# Patient Record
Sex: Male | Born: 1950
Health system: Southern US, Community
[De-identification: ages and names within clinical notes are randomized; demographics above are authoritative.]

## PROBLEM LIST (undated history)

## (undated) DIAGNOSIS — T7840XA Allergy, unspecified, initial encounter: Secondary | ICD-10-CM

## (undated) DIAGNOSIS — N529 Male erectile dysfunction, unspecified: Secondary | ICD-10-CM

## (undated) DIAGNOSIS — I471 Supraventricular tachycardia, unspecified: Secondary | ICD-10-CM

## (undated) DIAGNOSIS — N433 Hydrocele, unspecified: Secondary | ICD-10-CM

## (undated) DIAGNOSIS — I609 Nontraumatic subarachnoid hemorrhage, unspecified: Secondary | ICD-10-CM

## (undated) DIAGNOSIS — R002 Palpitations: Secondary | ICD-10-CM

## (undated) DIAGNOSIS — D369 Benign neoplasm, unspecified site: Secondary | ICD-10-CM

## (undated) DIAGNOSIS — K635 Polyp of colon: Secondary | ICD-10-CM

## (undated) DIAGNOSIS — H269 Unspecified cataract: Secondary | ICD-10-CM

## (undated) DIAGNOSIS — G5600 Carpal tunnel syndrome, unspecified upper limb: Secondary | ICD-10-CM

## (undated) HISTORY — DX: Male erectile dysfunction, unspecified: N52.9

## (undated) HISTORY — DX: Hydrocele, unspecified: N43.3

## (undated) HISTORY — PX: POLYPECTOMY: SHX149

## (undated) HISTORY — DX: Palpitations: R00.2

## (undated) HISTORY — DX: Nontraumatic subarachnoid hemorrhage, unspecified: I60.9

## (undated) HISTORY — DX: Carpal tunnel syndrome, unspecified upper limb: G56.00

## (undated) HISTORY — DX: Supraventricular tachycardia: I47.1

## (undated) HISTORY — DX: Supraventricular tachycardia, unspecified: I47.10

## (undated) HISTORY — PX: COLONOSCOPY: SHX174

## (undated) HISTORY — DX: Unspecified cataract: H26.9

## (undated) HISTORY — PX: CATARACT EXTRACTION, BILATERAL: SHX1313

## (undated) HISTORY — DX: Benign neoplasm, unspecified site: D36.9

## (undated) HISTORY — DX: Allergy, unspecified, initial encounter: T78.40XA

## (undated) HISTORY — DX: Polyp of colon: K63.5

---

## 1997-11-22 ENCOUNTER — Ambulatory Visit (HOSPITAL_COMMUNITY): Admission: RE | Admit: 1997-11-22 | Discharge: 1997-11-22 | Payer: Self-pay | Admitting: Internal Medicine

## 2001-07-12 DIAGNOSIS — D369 Benign neoplasm, unspecified site: Secondary | ICD-10-CM

## 2001-07-12 HISTORY — DX: Benign neoplasm, unspecified site: D36.9

## 2001-10-26 ENCOUNTER — Emergency Department (HOSPITAL_COMMUNITY): Admission: EM | Admit: 2001-10-26 | Discharge: 2001-10-26 | Payer: Self-pay | Admitting: Emergency Medicine

## 2006-01-09 DIAGNOSIS — I609 Nontraumatic subarachnoid hemorrhage, unspecified: Secondary | ICD-10-CM

## 2006-01-09 HISTORY — DX: Nontraumatic subarachnoid hemorrhage, unspecified: I60.9

## 2006-02-03 ENCOUNTER — Inpatient Hospital Stay (HOSPITAL_COMMUNITY): Admission: EM | Admit: 2006-02-03 | Discharge: 2006-02-05 | Payer: Self-pay | Admitting: Emergency Medicine

## 2006-02-04 ENCOUNTER — Ambulatory Visit: Payer: Self-pay | Admitting: Internal Medicine

## 2006-02-04 ENCOUNTER — Encounter: Payer: Self-pay | Admitting: Internal Medicine

## 2007-01-06 ENCOUNTER — Ambulatory Visit: Payer: Self-pay | Admitting: Gastroenterology

## 2007-01-20 ENCOUNTER — Ambulatory Visit: Payer: Self-pay | Admitting: Gastroenterology

## 2007-10-18 IMAGING — CT CT HEAD W/O CM
1 of 2 series · 13 of 30 positions shown, 17 images · non-contrast
Comparison: 02/03/06.

CLINICAL DATA: Closed head injury with subarachnoid hemorrhage.
 HEAD CT WITHOUT CONTRAST ? 02/05/06:
TECHNIQUE: Contiguous axial CT images were obtained from the base of the skull through the vertex according to standard protocol without contrast.

[Series 2: brain · axial · 0.47mm/px · z∈[+128,+262]mm · 13 of 32 slices shown, 17 images]
[im 3/32  brain]
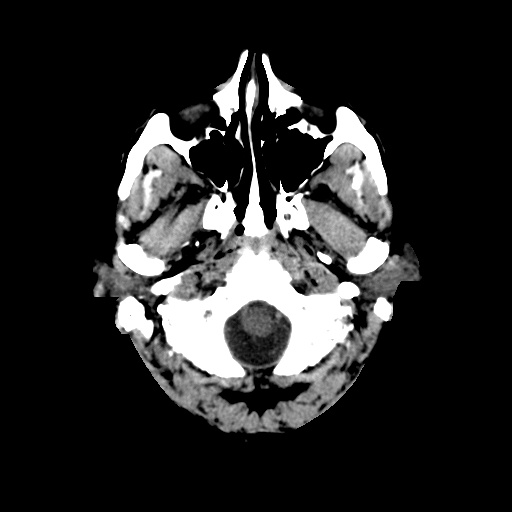
[im 3/32  bone]
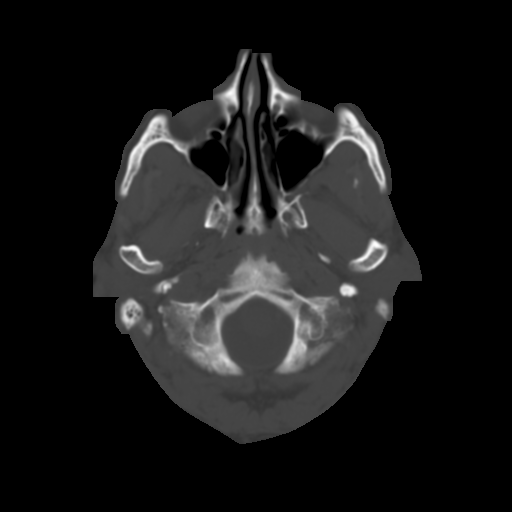
[im 5/32  brain]
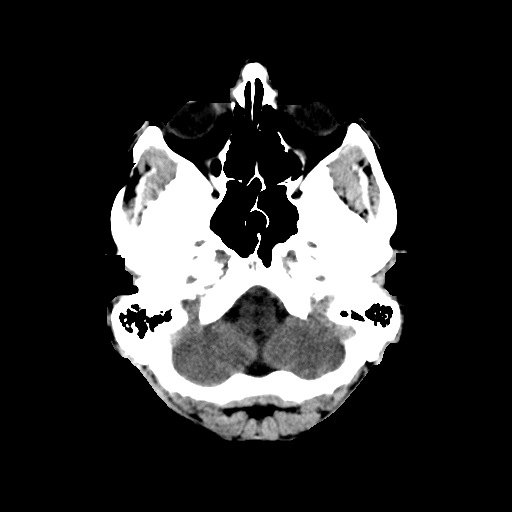
[im 7/32  brain]
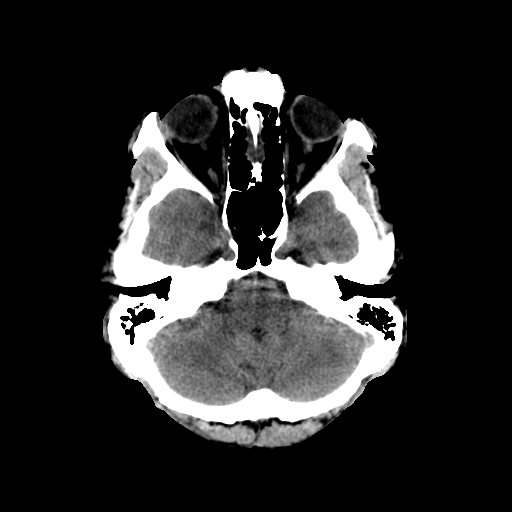
[im 9/32  brain]
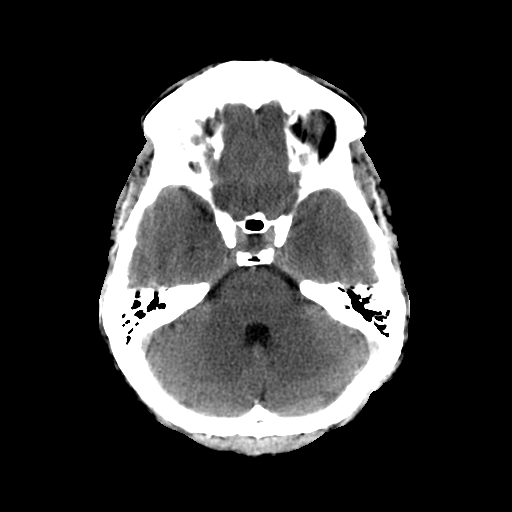
[im 12/32  brain]
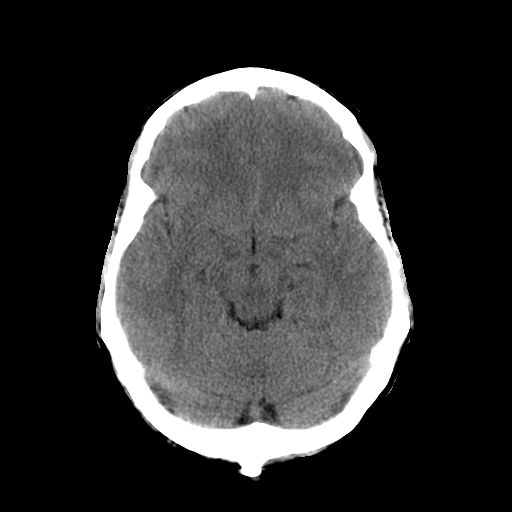
[im 12/32  bone]
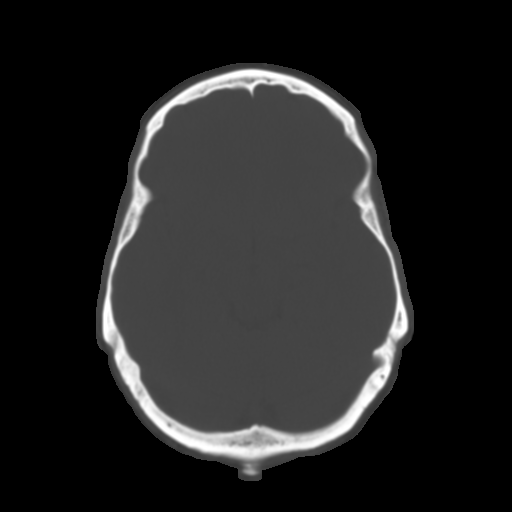
[im 14/32  brain]
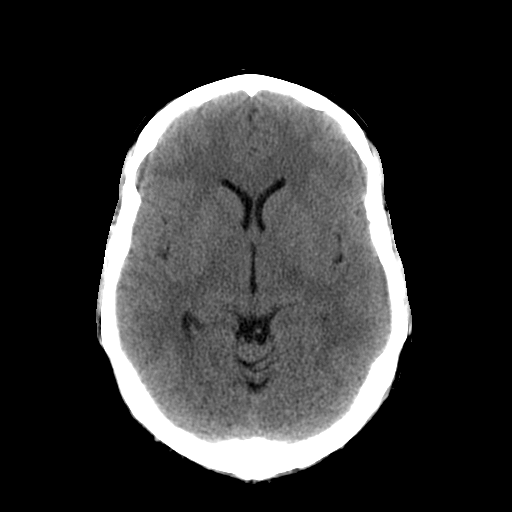
[im 16/32  brain]
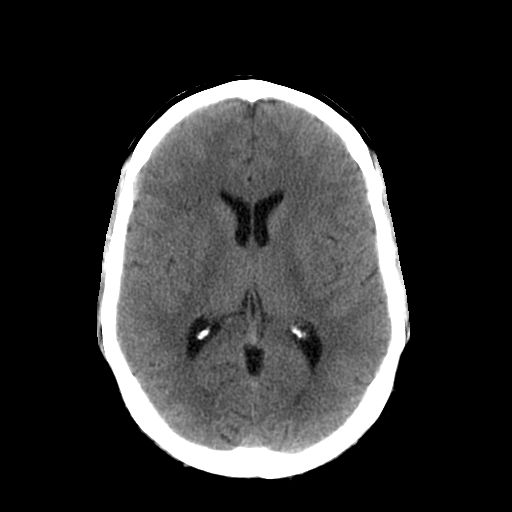
[im 18/32  brain]
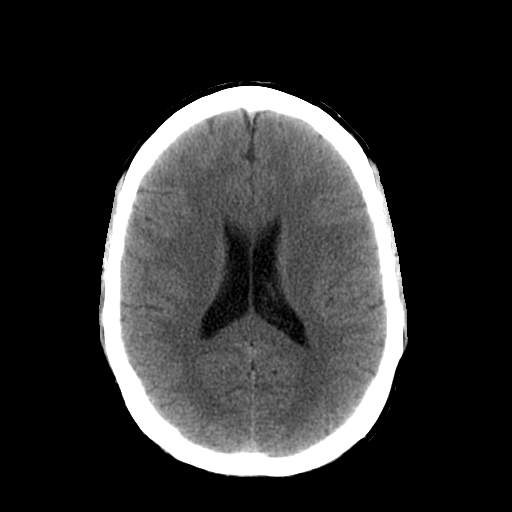
[im 20/32  brain]
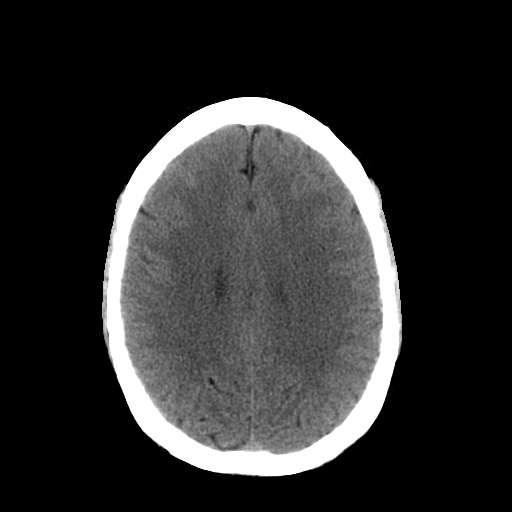
[im 20/32  bone]
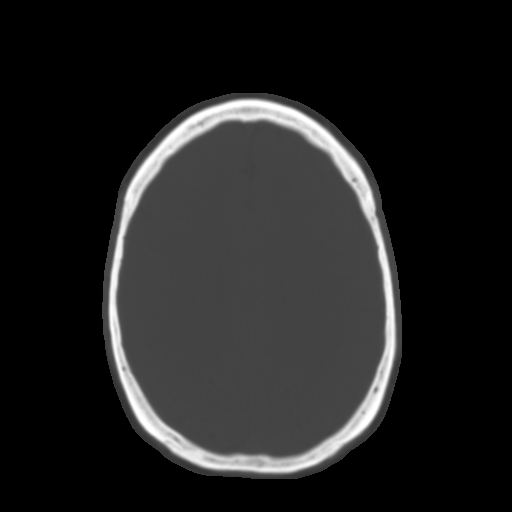
[im 23/32  brain]
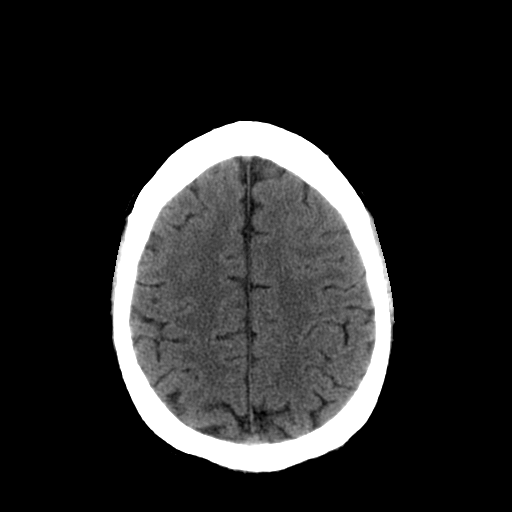
[im 25/32  brain]
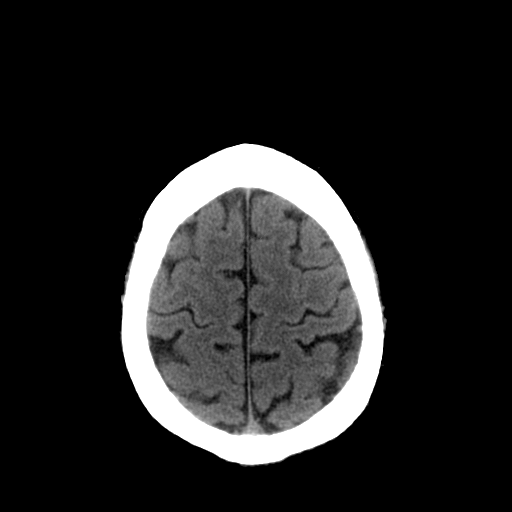
[im 27/32  brain]
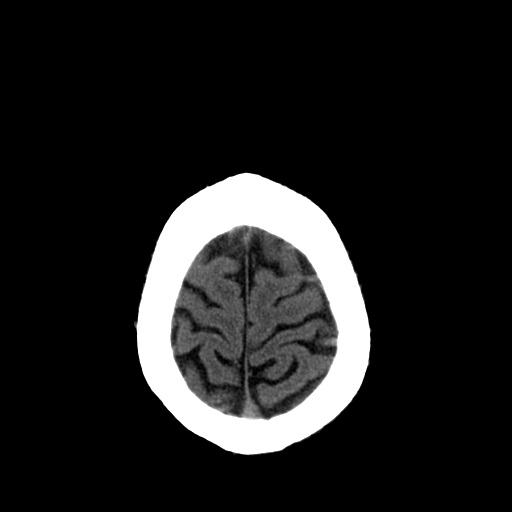
[im 29/32  brain]
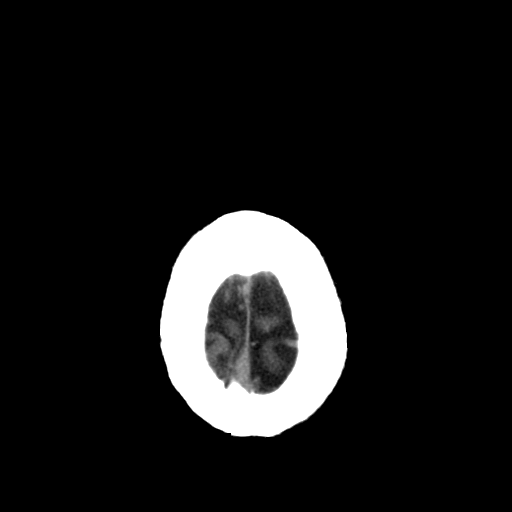
[im 29/32  bone]
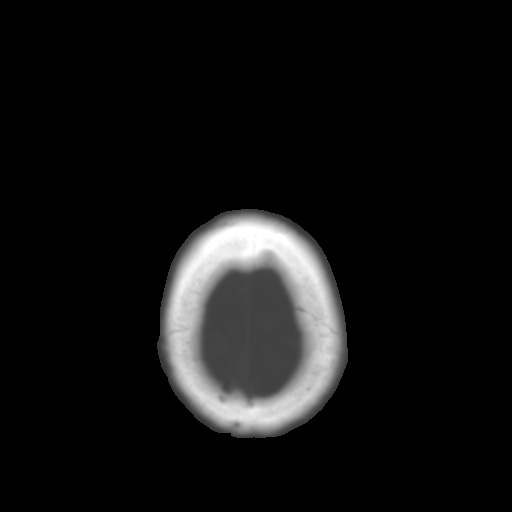

[13 of 30 positions shown; findings below may reference images not displayed]

FINDINGS: The small amount of subarachnoid blood seen on the prior head CT near the vertex is no longer visualized.  No mass effect, extra-axial fluid collections, skull fracture, or hydrocephalus are evident.  No intraventricular blood.
IMPRESSION: Resolution of subarachnoid hemorrhage since the prior CT.  No acute findings.

## 2008-12-06 ENCOUNTER — Encounter: Admission: RE | Admit: 2008-12-06 | Discharge: 2008-12-06 | Payer: Self-pay | Admitting: Internal Medicine

## 2010-08-18 IMAGING — US US SCROTUM
1 series · 13 of 25 positions shown · non-contrast
Comparison: None

CLINICAL DATA: Scrotal swelling.

ULTRASOUND OF SCROTUM
TECHNIQUE: Complete ultrasound examination of the testicles,
epididymis, and other scrotal structures was performed.

[Series 1: us scrotum · 0.07mm/px · 13 of 75 slices shown]
[im 1/75]
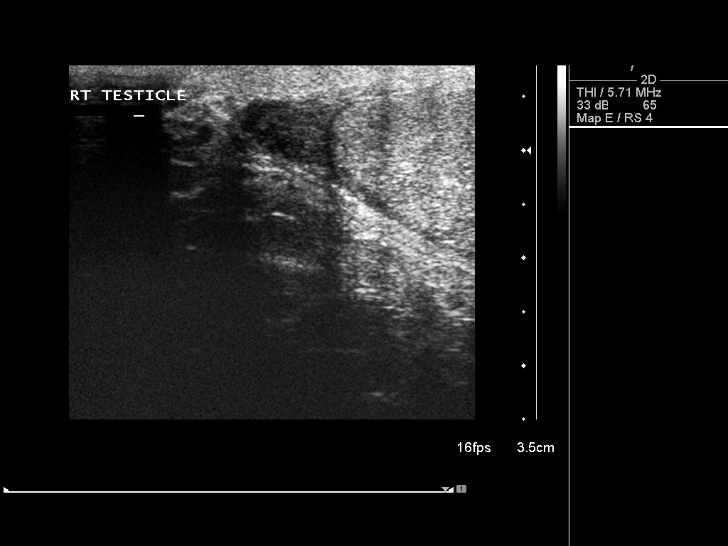
[im 7/75]
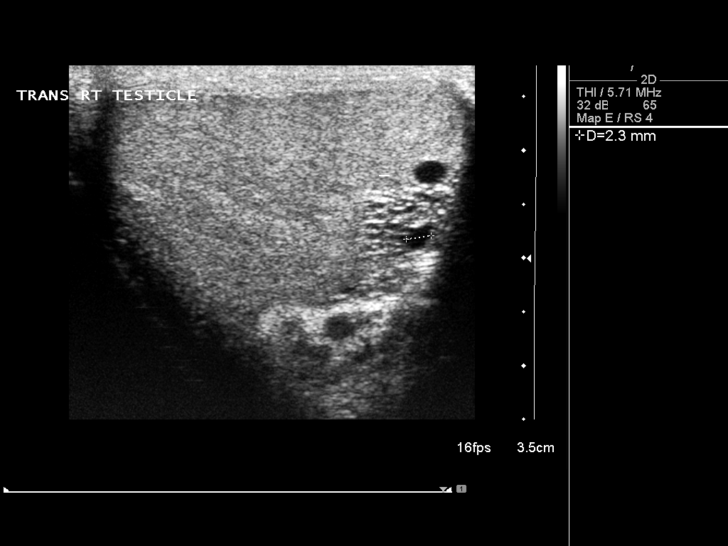
[im 13/75]
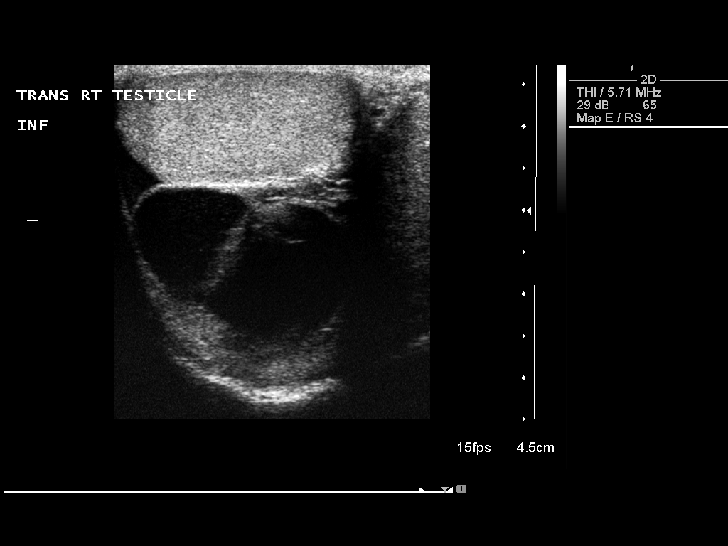
[im 19/75]
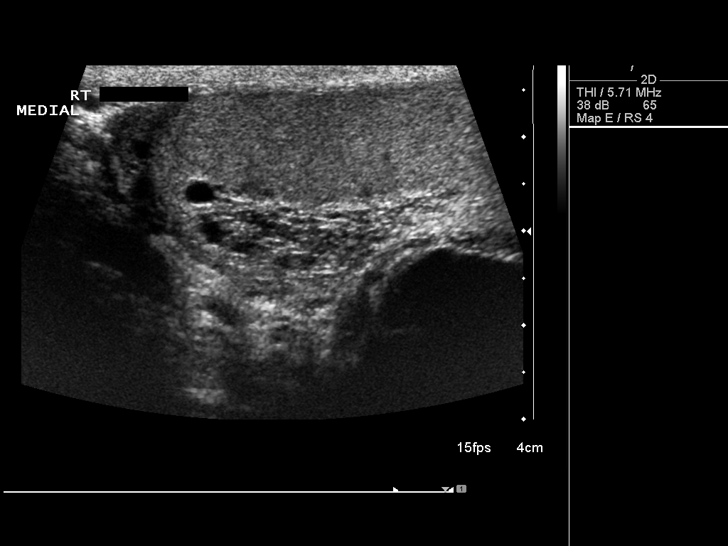
[im 25/75]
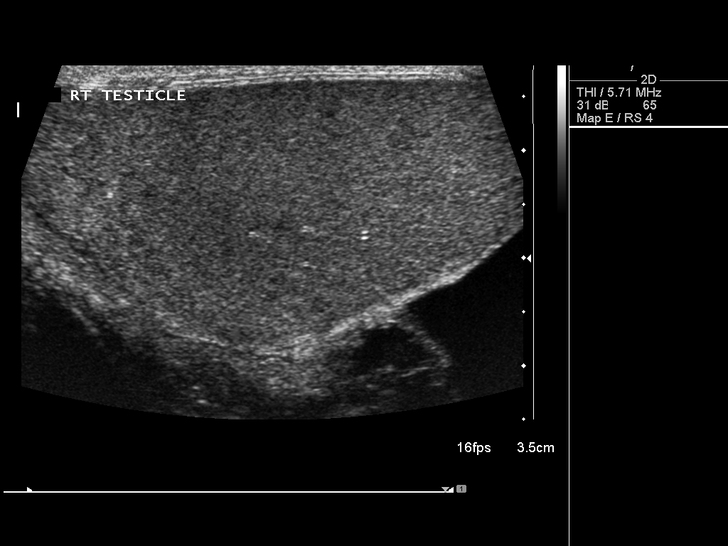
[im 31/75]
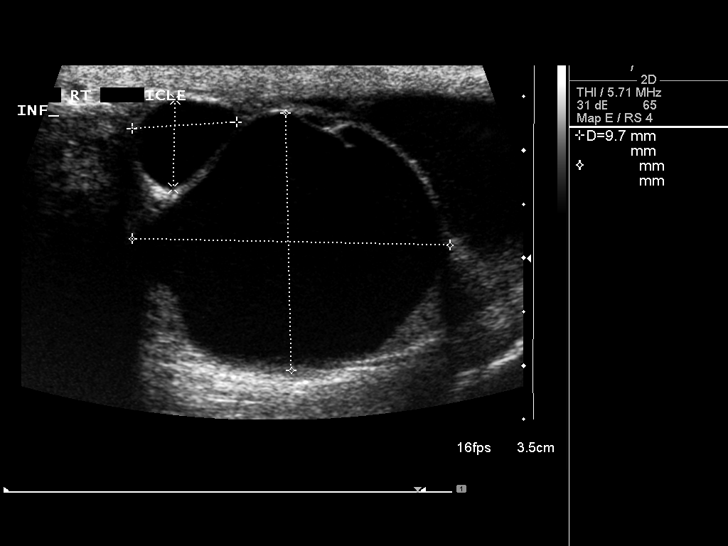
[im 38/75]
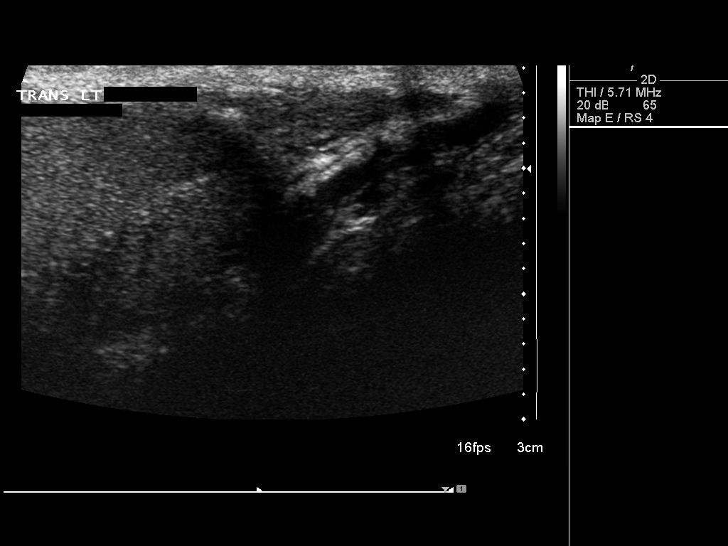
[im 44/75]
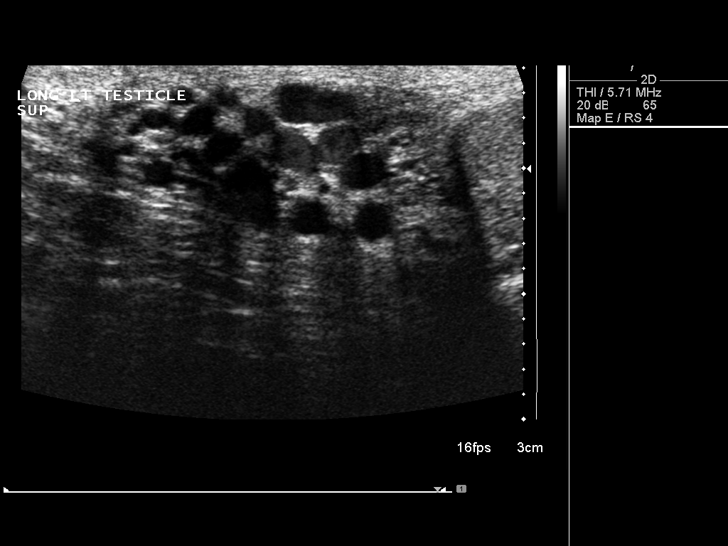
[im 50/75]
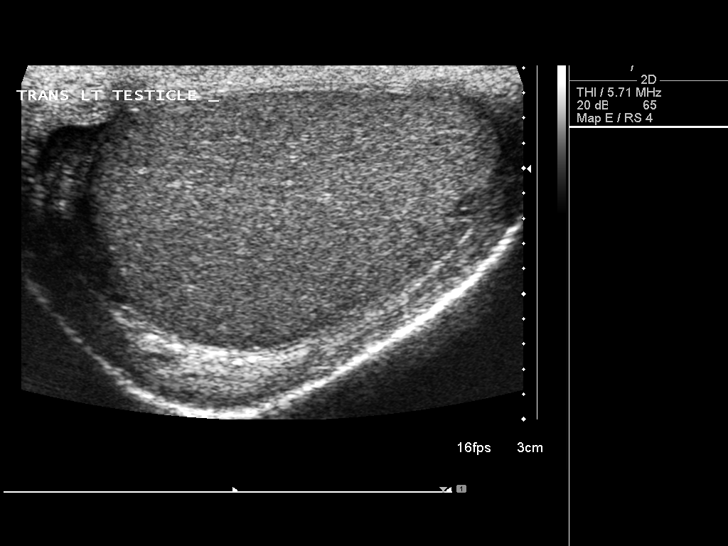
[im 56/75]
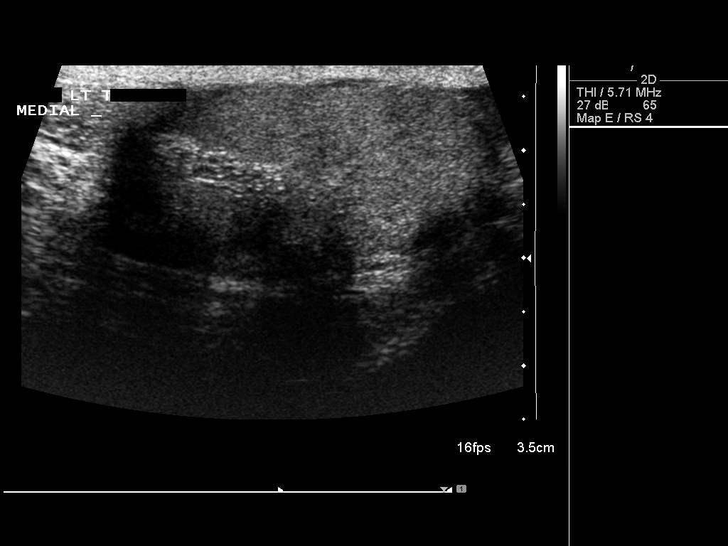
[im 62/75]
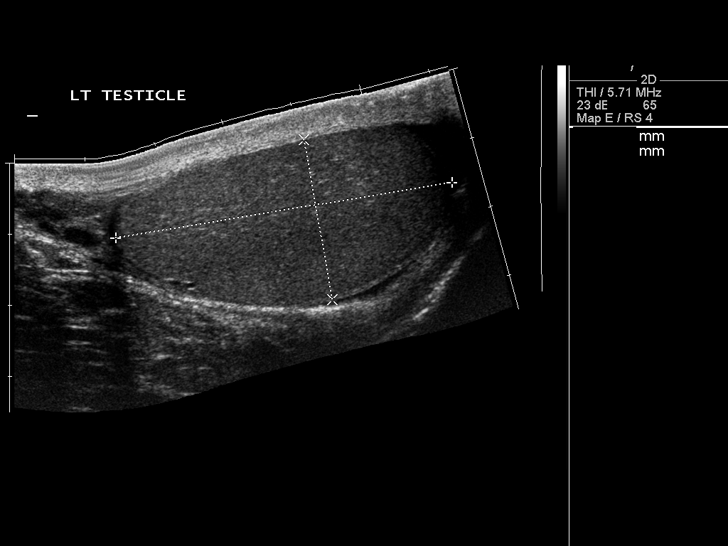
[im 68/75]
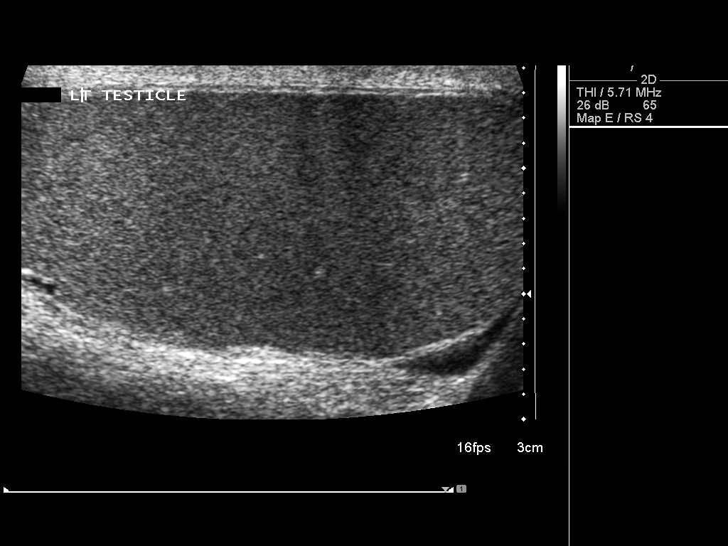
[im 75/75]
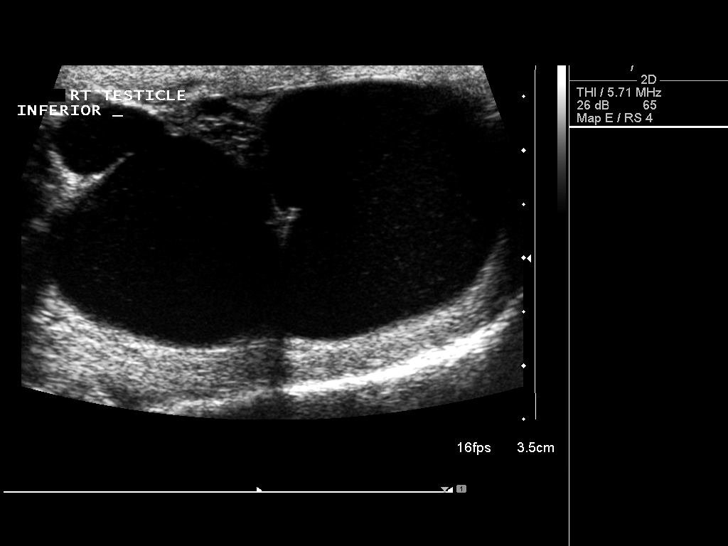

[13 of 25 positions shown; findings below may reference images not displayed]

FINDINGS: The right testicle demonstrates a dilated rete testis,
with multiple small cystic spaces measuring 3 mm or below in size.

Several cystic lesions along the inferior testicular margin on the
right side are noted, with several septations but without a
significant solid component.  These appear nearly continuous with
the dilated rete testis, but also appear to be predominantly
extratesticular.  One of these measures 1.0 x 0.8 x 0.7 cm; the
second measures 3.0 x 2.4 x 2.3 cm; and the third measures 2.6 x
2.5 x 2.7 cm.

The right testicle (without including the cysts) measures 5.3 x
x 3.6 cm.  Color flow noted in the testicular parenchyma although
Doppler analysis was not requested.

Left testicle measures 4.8 x 2.3 x 3.4 cm.  There is a small left
varicocele and a very small left hydrocele.  Expected color flow
noted in the left testicle.
IMPRESSION: 1.  Dilated rete testis on the right, associated with several
prominent extratesticular cystic lesions adjacent to the rete
testis.  This appearance has been described in cystadenoma of the
rete testis, with differential diagnostic considerations including
prominent ectasia in the setting of dilated rete testis, or
conceivably large epididymal tail cysts or chronic hydrocele.  Rete
testis cystadenocarcinoma seems unlikely given the lack of
significant solid component.  Because rete testis cystadenoma is
typically treated with resection, I would recommend urology
consultation for further assessment.
2.  Small left hydrocele and small left varicocele.

## 2010-11-27 NOTE — Discharge Summary (Signed)
NAMEJERSEY, Joshua Poole                 ACCOUNT NO.:  [id number]   MEDICAL RECORD NO.:  [id number]          PATIENT TYPE:  INP   LOCATION:  3735                         FACILITY:  [facility]   PHYSICIAN:  Hillery Aldo, M.D.   DATE OF BIRTH:  1951-05-08   DATE OF ADMISSION:  02/03/2006  DATE OF DISCHARGE:  02/05/2006                                 DISCHARGE SUMMARY   PRIMARY CARE PHYSICIAN:  Erskine Speed, M.D.   DISCHARGE DIAGNOSES:  1.  Left thalamic 3 x 2 x 2.4-cm subarachnoid hemorrhage.  2.  Fall with head injury.  3.  History of premature atrial contractions.   DISCHARGE MEDICATIONS:  None.   CONSULTATIONS:  Stefani Dama, M.D. of neurosurgery.   BRIEF ADMISSION HISTORY OF PRESENT ILLNESS:  The patient is a 60 year old  male who tripped and fell.  Initially, he did not recall the preceding  events and there was some concern that he had a syncopal episode.  However,  this morning the patient recalls completely what occurred and he says that  he simply lost his footing and fell, striking his head on concrete.  Nevertheless, when he first presented, he was admitted for treatment of his  subarachnoid hemorrhage and workup for possible syncopal event.   PROCEDURES AND DIAGNOSTIC STUDIES:  1.  Chest X-Ray:  On February 03, 2006, showed no active cardiopulmonary      disease.  2.  Right knee films on February 03, 2006, were negative for fracture.  3.  Pelvis film on February 03, 2006, were negative for fracture or diastasis.  4.  CT scan of the spine on February 03, 2006, showed no static evidence of      cervical spine injury.  5.  CT scan of the head on February 03, 2006, showed a small amount of      subarachnoid blood, right parietal region.  6.  Carotid Dopplers on February 04, 2006, showed no ICA stenosis bilaterally.  7.  Venous Dopplers of the lower extremities on February 04, 2006, showed no      evidence of DVT bilaterally.  8.  A 2-D echocardiogram on February 04, 2006, showed normal LV function with  an      ejection fraction of 65%.  9.  Repeat CT scan of the head on February 05, 2006, showed resolution of the      subarachnoid hemorrhage.   DISCHARGE LABORATORY VALUES:  Cardiac enzymes were negative.  B12, TSH were  within normal limits.  RPR was nonreactive.   ASSESSMENT/PLAN:  1.  Subarachnoid hemorrhage secondary to fall.  The patient does now recall      the events preceding the fall and there was no evidence of any syncope.      Nevertheless, a syncope workup was undertaken because the patient could      not recall the events preceding fall.  This workup was negative.  The      patient has remained in normal sinus rhythm on telemetry monitoring.  A      2-D echocardiogram was within normal limits.  Cardiac  enzymes were      negative.  His subarachnoid hemorrhage has resolved on repeat CT      scanning of the head and the patient no longer complains of headache and      is therefore, stable for discharge.  He is instructed to use Tylenol as      needed for headache pain.  2.  History of premature atrial contraction.  No evidence of any of these on      telemetry monitoring overnight.  3.  Right lower extremity tenderness.  This is related to his fall.  Deep      vein thrombosis studies were negative.   DISPOSITION:  The patient is stable for discharge home.  He should follow up  with his primary care physician early next week.      Hillery Aldo, M.D.  Electronically Signed     CR/MEDQ  D:  02/05/2006  T:  02/05/2006  Job:  [id number]   cc:   Erskine Speed, M.D.

## 2010-11-27 NOTE — H&P (Signed)
NAMEBASSEL, GASKILL                 ACCOUNT NO.:  [id number]   MEDICAL RECORD NO.:  [id number]          PATIENT TYPE:  EMS   LOCATION:  MAJO                         FACILITY:  [facility]   PHYSICIAN:  Merlene Laughter. Renae Gloss, M.D.DATE OF BIRTH:  10-06-1950   DATE OF ADMISSION:  02/03/2006  DATE OF DISCHARGE:                                HISTORY & PHYSICAL   PRIMARY CARE PHYSICIAN:  Erskine Speed, M.D.   This patient is a full-code.   CHIEF COMPLAINT:  Headache, acute confusion status post fall.   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  In any event, he had loss of consciousness of about an  hour.  As per EMS report, he was found by a co-worker who came to the office  and found him lying on the floor complaining of his right leg hurting.  Also, according to EMS record, he said he was walking to the building and  had a sudden dull, aching pain to the back of his right leg, started to feel  dizzy and nauseous, and his leg gave out and he fell to the floor.  He  subsequently suffered a small laceration hematoma in the back of his head.  He also, at arrival to the emergency department, complained of severe  headache.  He has no other acute constitutional or systemic complaints at  this time.   PAST MEDICAL HISTORY:  History of intermittent PAC's.   FAMILY HISTORY:  Significant for hypertension in both parents who are living  and diabetes in brother who is living.   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  He denies tobacco, alcohol or drug abuse.   ALLERGIES:  NKDA.   MEDICATIONS:  None.   REVIEW OF SYSTEMS:  As per HPI, otherwise, negative.  A full review of  systems was obtained.   PHYSICAL EXAMINATION:  VITAL SIGNS:  Temperature 98.4, pulse 70,  respirations 16, blood pressure 124/77, O2 saturation on room air  100%.  GENERAL:  Well-developed, well-nourished white male in no acute respiratory  distress.  Headache has now resolved.  HEENT:  TM's are within normal limits bilaterally.  No oropharyngeal  lesions.  PERRLA.  Small posterior scalp lacerations, no bleeding.  NECK:  Supple.  No mass, 2+ carotids, no bruits.  LUNGS:  Clear to auscultation bilaterally.  HEART:  S1, S2.  Regular rate and rhythm.  No murmurs, rubs or gallops.  ABDOMEN:  Soft, nontender, nondistended, positive bowel sounds.  EXTREMITIES:  No cyanosis, clubbing or edema.  Right posterior thigh  abrasion.  SKIN:  Warm, intact.  NEUROLOGICAL:  Alert and oriented x3.  Cranial nerves intact.  The patient  moves extremities spontaneously.   LABORATORY DATA:  WBC 13.0, hemoglobin 14.6, platelets 188,000.  Head CT  scan revealed a left __________ 3.2x2.4 cm with extension into left  ventricle and with 4 mm midline shift.  CT scan also  showed chronic  microvascular white matter disease.  Pelvis and right knee films were  negative for fractures.   ASSESSMENT/PLAN:  Head injury with resulting subarachnoid bleed.  Dr.  Laurice Record, ED physician, did consult with Dr. Danielle Dess.  Upon review of patient  CT scan, Dr. Danielle Dess thinks that this is insignificant finding in light of no  focal neurological symptoms.  He suggests observation and discharge to home  if patient continues to be stable from a neurological standpoint.   Mr. Bollman's headache has resolved since his admission into the emergency  department.  However, the etiology of his apparent fall is unclear.  As this  may well have been a syncope episode, he will be admitted to telemetry for  cardiac monitoring.  A 2D echo will be obtained.  A 2D echo will be obtained  as he does have chronic white matter microvascular disease.  Orthostatic  blood pressure will need to be obtained as well.   A right leg lower leg venous Doppler will also be obtained as the patient  did complain of a  sudden pain in the back of his leg which apparently  precipitated events as well.           ______________________________  Merlene Laughter. Renae Gloss, M.D.     KRS/MEDQ  D:  02/03/2006  T:  02/03/2006  Job:  [id number]   cc:   Erskine Speed, M.D.  Fax: [phone]

## 2014-01-03 ENCOUNTER — Encounter: Payer: Self-pay | Admitting: Gastroenterology

## 2014-05-12 HISTORY — PX: CYST REMOVAL NECK: SHX6281

## 2014-11-18 ENCOUNTER — Encounter (HOSPITAL_COMMUNITY): Payer: Self-pay | Admitting: Cardiology

## 2014-11-18 ENCOUNTER — Emergency Department (HOSPITAL_COMMUNITY)
Admission: EM | Admit: 2014-11-18 | Discharge: 2014-11-18 | Disposition: A | Discharge status: Home / Self Care | Payer: PPO | Attending: Emergency Medicine | Admitting: Emergency Medicine

## 2014-11-18 DIAGNOSIS — R000 Tachycardia, unspecified: Secondary | ICD-10-CM | POA: Diagnosis present

## 2014-11-18 DIAGNOSIS — I471 Supraventricular tachycardia: Secondary | ICD-10-CM | POA: Insufficient documentation

## 2014-11-18 LAB — BASIC METABOLIC PANEL
ANION GAP: 12 (ref 5–15)
BUN: 20 mg/dL (ref 6–20)
CO2: 20 mmol/L — ABNORMAL LOW (ref 22–32)
CREATININE: 0.95 mg/dL (ref 0.61–1.24)
Calcium: 9.4 mg/dL (ref 8.9–10.3)
Chloride: 107 mmol/L (ref 101–111)
GLUCOSE: 92 mg/dL (ref 70–99)
POTASSIUM: 4.2 mmol/L (ref 3.5–5.1)
Sodium: 139 mmol/L (ref 135–145)

## 2014-11-18 LAB — MAGNESIUM: Magnesium: 2.2 mg/dL (ref 1.7–2.4)

## 2014-11-18 NOTE — ED Notes (Signed)
Pt reports that he came from PCP office today with abnormal EKG and told that he was tachycardiac. Pt denies any chest pain or SOB.

## 2014-11-18 NOTE — Discharge Instructions (Signed)
Supraventricular Tachycardia °Supraventricular tachycardia (SVT) is an abnormal heart rhythm (arrhythmia) that causes the heart to beat very fast (tachycardia). This kind of fast heartbeat originates in the upper chambers of the heart (atria). SVT can cause the heart to beat greater than 100 beats per minute. SVT can have a rapid burst of heartbeats. This can start and stop suddenly without warning and is called nonsustained. SVT can also be sustained, in which the heart beats at a continuous fast rate.  °CAUSES  °There can be different causes of SVT. Some of these include: °· Heart valve problems such as mitral valve prolapse. °· An enlarged heart (hypertrophic cardiomyopathy). °· Congenital heart problems. °· Heart inflammation (pericarditis). °· Hyperthyroidism. °· Low potassium or magnesium levels. °· Caffeine. °· Drug use such as cocaine, methamphetamines, or stimulants. °· Some over-the-counter medicines such as: °¨ Decongestants. °¨ Diet medicines. °¨ Herbal medicines. °SYMPTOMS  °Symptoms of SVT can vary. Symptoms depend on whether the SVT is sustained or nonsustained. You may experience: °· No symptoms (asymptomatic). °· An awareness of your heart beating rapidly (palpitations). °· Shortness of breath. °· Chest pain or pressure. °If your blood pressure drops because of the SVT, you may experience: °· Fainting or near fainting. °· Weakness. °· Dizziness. °DIAGNOSIS  °Different tests can be performed to diagnose SVT, such as: °· An electrocardiogram (EKG). This is a painless test that records the electrical activity of your heart. °· Holter monitor. This is a 24 hour recording of your heart rhythm. You will be given a diary. Write down all symptoms that you have and what you were doing at the time you experienced symptoms. °· Arrhythmia monitor. This is a small device that your wear for several weeks. It records the heart rhythm when you have symptoms. °· Echocardiogram. This is an imaging test to help detect  abnormal heart structure such as congenital abnormalities, heart valve problems, or heart enlargement. °· Stress test. This test can help determine if the SVT is related to exercise. °· Electrophysiology study (EPS). This is a procedure that evaluates your heart's electrical system and can help your caregiver find the cause of your SVT. °TREATMENT  °Treatment of SVT depends on the symptoms, how often it recurs, and whether there are any underlying heart problems.  °· If symptoms are rare and no other cardiac disease is present, no treatment may be needed. °· Blood work may be done to check potassium, magnesium, and thyroid hormone levels to see if they are abnormal. If these levels are abnormal, treatment to correct the problems will occur. °Medicines °Your caregiver may use oral medicines to treat SVT. These medicines are given for long-term control of SVT. Medicines may be used alone or in combination with other treatments. These medicines work to slow nerve impulses in the heart muscle. These medicines can also be used to treat high blood pressure. Some of these medicines may include: °· Calcium channel blockers. °· Beta blockers. °· Digoxin. °Nonsurgical procedures °Nonsurgical techniques may be used if oral medicines do not work. Some examples include: °· Cardioversion. This technique uses either drugs or an electrical shock to restore a normal heart rhythm. °¨ Cardioversion drugs may be given through an intravenous (IV) line to help "reset" the heart rhythm. °¨ In electrical cardioversion, the caregiver shocks your heart to stop its beat for a split second. This helps to reset the heart to a normal rhythm. °· Ablation. This procedure is done under mild sedation. High frequency radio wave energy is used to   destroy the area of heart tissue responsible for the SVT. °HOME CARE INSTRUCTIONS  °· Do not smoke. °· Only take medicines prescribed by your caregiver. Check with your caregiver before using over-the-counter  medicines. °· Check with your caregiver about how much alcohol and caffeine (coffee, tea, colas, or chocolate) you may have. °· It is very important to keep all follow-up referrals and appointments in order to properly manage this problem. °SEEK IMMEDIATE MEDICAL CARE IF: °· You have dizziness. °· You faint or nearly faint. °· You have shortness of breath. °· You have chest pain or pressure. °· You have sudden nausea or vomiting. °· You have profuse sweating. °· You are concerned about how long your symptoms last. °· You are concerned about the frequency of your SVT episodes. °If you have the above symptoms, call your local emergency services (911 in [place] immediately. Do not drive yourself to the hospital. °MAKE SURE YOU:  °· Understand these instructions. °· Will watch your condition. °· Will get help right away if you are not doing well or get worse. °Document Released: 06/28/2005 Document Revised: 09/20/2011 Document Reviewed: 10/10/2008 °ExitCare® Patient Information ©2015 [employer]. This information is not intended to replace advice given to you by your health care provider. Make sure you discuss any questions you have with your health care provider. ° °

## 2014-11-18 NOTE — ED Notes (Signed)
EDP at bedside perform maneuver bear down with lifting patient's legs while patient in supine position.

## 2014-11-18 NOTE — ED Provider Notes (Signed)
CSN: [id number]     Arrival date & time 11/18/14  1416 History   First MD Initiated Contact with Patient 11/18/14 1431     Chief Complaint  Patient presents with  . Tachycardia     (Consider location/radiation/quality/duration/timing/severity/associated sxs/prior Treatment) Patient is a 64 y.o. male presenting with general illness. The history is provided by the patient.  Illness Location:  General Quality:  Tachycardia Severity:  Moderate Onset quality:  Sudden Duration:  1 hour Timing:  Constant Progression:  Unchanged Chronicity:  New Relieved by:  Nothing Worsened by:  Nothing Ineffective treatments:  Vagal maneuvers at home Associated symptoms: no abdominal pain, no cough, no fever, no shortness of breath and no vomiting     History reviewed. No pertinent past medical history. History reviewed. No pertinent past surgical history. History reviewed. No pertinent family history. History  Substance Use Topics  . Smoking status: Never Smoker   . Smokeless tobacco: Not on file  . Alcohol Use: No    Review of Systems  Constitutional: Negative for fever and chills.  Respiratory: Negative for cough and shortness of breath.   Gastrointestinal: Negative for vomiting and abdominal pain.  All other systems reviewed and are negative.     Allergies  Review of patient's allergies indicates no known allergies.  Home Medications   Prior to Admission medications   Not on File   BP 120/80 mmHg  Pulse 176  Temp(Src) 98.2 F (36.8 C) (Oral)  Resp 18  SpO2 99% Physical Exam  Constitutional: He is oriented to person, place, and time. He appears well-developed and well-nourished. No distress.  HENT:  Head: Normocephalic and atraumatic.  Mouth/Throat: Oropharynx is clear and moist. No oropharyngeal exudate.  Eyes: EOM are normal. Pupils are equal, round, and reactive to light.  Neck: Normal range of motion. Neck supple.  Cardiovascular: Regular rhythm.  Tachycardia  present.  Exam reveals no friction rub.   No murmur heard. Pulmonary/Chest: Effort normal and breath sounds normal. No respiratory distress. He has no wheezes. He has no rales.  Abdominal: Soft. He exhibits no distension. There is no tenderness. There is no rebound.  Musculoskeletal: Normal range of motion. He exhibits no edema.  Neurological: He is alert and oriented to person, place, and time. No cranial nerve deficit. He exhibits normal muscle tone. Coordination normal.  Skin: Skin is warm. No rash noted. He is not diaphoretic.  Nursing note and vitals reviewed.   ED Course  Procedures (including critical care time) Labs Review Labs Reviewed  BASIC METABOLIC PANEL - Abnormal; Notable for the following:    CO2 20 (*)    All other components within normal limits  MAGNESIUM    Imaging Review No results found.   EKG Interpretation   Date/Time:  Monday Nov 18 2014 14:29:48 EDT Ventricular Rate:  176 PR Interval:    QRS Duration: 96 QT Interval:  272 QTC Calculation: 465 R Axis:   -46 Text Interpretation:  Supraventricular tachycardia Left axis deviation  Incomplete right bundle branch block Left ventricular hypertrophy with  repolarization abnormality Abnormal ECG Similar to prior Confirmed by  Mingo Amber  MD, Floyd ([id number]) on 11/18/2014 2:31:42 PM      MDM   Final diagnoses:  SVT (supraventricular tachycardia)    64 year old male here with SVT. History of same, is on metoprolol for this. Has been in for 1 hour. No relief with straining exercises at home. Afebrile here, heart rate in the 170s. Vagal maneurvers performed with abdominal straining and leg  lifts with conversion back to sinus rhythm. Labs ok. No further SVT. Stable for discharge.  Evelina Bucy, MD 11/18/14 [phone]

## 2015-01-06 ENCOUNTER — Other Ambulatory Visit: Payer: Self-pay

## 2015-07-24 DIAGNOSIS — I471 Supraventricular tachycardia: Secondary | ICD-10-CM | POA: Diagnosis not present

## 2016-01-22 DIAGNOSIS — Z23 Encounter for immunization: Secondary | ICD-10-CM | POA: Diagnosis not present

## 2016-01-22 DIAGNOSIS — D559 Anemia due to enzyme disorder, unspecified: Secondary | ICD-10-CM | POA: Diagnosis not present

## 2016-01-22 DIAGNOSIS — I471 Supraventricular tachycardia: Secondary | ICD-10-CM | POA: Diagnosis not present

## 2016-01-22 DIAGNOSIS — Z0000 Encounter for general adult medical examination without abnormal findings: Secondary | ICD-10-CM | POA: Diagnosis not present

## 2016-01-22 DIAGNOSIS — E785 Hyperlipidemia, unspecified: Secondary | ICD-10-CM | POA: Diagnosis not present

## 2016-01-22 DIAGNOSIS — Z125 Encounter for screening for malignant neoplasm of prostate: Secondary | ICD-10-CM | POA: Diagnosis not present

## 2016-01-22 DIAGNOSIS — K51419 Inflammatory polyps of colon with unspecified complications: Secondary | ICD-10-CM | POA: Diagnosis not present

## 2016-02-05 ENCOUNTER — Other Ambulatory Visit (HOSPITAL_COMMUNITY): Payer: Self-pay | Admitting: Internal Medicine

## 2016-02-05 DIAGNOSIS — I4891 Unspecified atrial fibrillation: Secondary | ICD-10-CM | POA: Diagnosis not present

## 2016-02-05 DIAGNOSIS — I471 Supraventricular tachycardia: Secondary | ICD-10-CM

## 2016-02-11 ENCOUNTER — Ambulatory Visit (HOSPITAL_COMMUNITY)
Admission: RE | Admit: 2016-02-11 | Discharge: 2016-02-11 | Disposition: A | Discharge status: Home / Self Care | Payer: PPO | Source: Ambulatory Visit | Attending: Internal Medicine | Admitting: Internal Medicine

## 2016-02-11 DIAGNOSIS — I071 Rheumatic tricuspid insufficiency: Secondary | ICD-10-CM | POA: Insufficient documentation

## 2016-02-11 DIAGNOSIS — I471 Supraventricular tachycardia: Secondary | ICD-10-CM

## 2016-02-11 DIAGNOSIS — I517 Cardiomegaly: Secondary | ICD-10-CM | POA: Insufficient documentation

## 2016-02-11 NOTE — Progress Notes (Signed)
*  PRELIMINARY RESULTS* Echocardiogram 2D Echocardiogram has been performed.  Leavy Cella 02/11/2016, 10:41 AM

## 2016-02-23 ENCOUNTER — Encounter: Payer: Self-pay | Admitting: Internal Medicine

## 2016-03-05 ENCOUNTER — Encounter: Payer: Self-pay | Admitting: Internal Medicine

## 2016-03-05 ENCOUNTER — Ambulatory Visit (INDEPENDENT_AMBULATORY_CARE_PROVIDER_SITE_OTHER): Payer: PPO | Admitting: Internal Medicine

## 2016-03-05 VITALS — BP 130/70 | HR 53 | Ht 74.0 in | Wt 222.0 lb

## 2016-03-05 DIAGNOSIS — I471 Supraventricular tachycardia: Secondary | ICD-10-CM

## 2016-03-05 NOTE — Patient Instructions (Addendum)
Medication Instructions:  Your physician recommends that you continue on your current medications as directed. Please refer to the Current Medication list given to you today.   Labwork: None Ordered   Testing/Procedures:  Cardiac Ablation  Cardiac ablation is a procedure to stop some heart tissue from causing problems. The heart has many electrical connections. Sometimes these connections cause the heart to beat very fast or irregularly. Removing some of the problem areas can improve heart rhythm or make it normal. Ablation is done for people who:   Have Wolff-Parkinson-White syndrome.  Have other fast heart rhythms (tachycardia).  Have taken medicines for an abnormal heart rhythm (arrhythmia) and the medicines had: ? No success. ? Side effects.  May have a type of heartbeat that could cause death. BEFORE THE PROCEDURE   Follow instructions from your doctor about eating and drinking before the procedure.  Take your medicines as told by your doctor. Take them at regular times with water unless told differently by your doctor.  If you are taking diabetes medicine, ask your doctor how to take it. Ask if there are any special instructions you should follow. Your doctor may change how much insulin you take the day of the procedure. PROCEDURE   A special type of X-ray will be used. The X-ray helps your doctor see images of your heart during the procedure.  A small cut (incision) will be made in your neck or groin.  An IV tube will be started before the procedure begins.  You will be given a numbing medicine (anesthetic) or a medicine to help you relax (sedative).  The skin on your neck or groin will be numbed.  A needle will be put into a large vein in your neck or groin.  A thin, flexible tube (catheter) will be put in to reach your heart.  A dye will be put in the tube. The dye will show up on X-rays. It will help your doctor see the area of the heart that needs  treatment.  When the heart tissue that is causing problems is found, the tip of the tube will send an electrical current to it. This will stop it from causing problems.  The tube will be taken out.  Pressure will be put on the area where the tube was. This will keep it from bleeding. A bandage will be placed over the area. AFTER THE PROCEDURE  You will be taken to a recovery area. Your blood pressure, heart rate, and breathing will be watched. The area where the tube was will also be watched for bleeding.  You will need to lie still for 4-6 hours. This keeps the area where the tube was from bleeding.  This information is not intended to replace advice given to you by your health care provider. Make sure you discuss any questions you have with your health care provider.  Document Released: 02/28/2013 Document Revised: 07/19/2014 Document Reviewed: 02/28/2013 Elsevier Interactive Patient Education 2016 Green: Your physician recommends that you call back to schedule SVT Ablation - ask for Janan Halter, RN Potential dates for your procedure: Wed. Sept 13 Wed. Sept 20 Mon. Sept 25 Mon. Oct. 2 Mon. Oct. 16 Wed. Oct 25 Thurs. Oct 26   If you need a refill on your cardiac medications before your next appointment, please call your pharmacy.   Thank you for choosing [facility]! Christen Bame, RN [phone]

## 2016-03-05 NOTE — Progress Notes (Signed)
HPI Joshua Poole is referred today by Dr. Nyoka Cowden for evaluation of SVT. He is a pleasant 65 yo man who has had SVT his entire adult life. He has been to the ER with HR's in the 180 range, terminated by vagal manuevers. He is on Cardizem and most recently metoprolol. He feels fatigue and lethargy from the metoprolol. He also notes PAC's. He denies caffeine or ETOH use/abuse. No syncope. In SVT he feels bad and notes sob. The episodes start and stop suddenly. He has documented short RP tachycardia at 176/min. No Known Allergies   Current Outpatient Prescriptions  Medication Sig Dispense Refill  . Ascorbic Acid (VITAMIN C) 1000 MG tablet Take 1,000 mg by mouth daily.    . cetirizine (KLS ALLER-TEC) 10 MG tablet Take 10 mg by mouth daily.    . Cranberry (SM CRANBERRY) 300 MG tablet Take 300 mg by mouth daily.    Marland Kitchen diltiazem (TIAZAC) 360 MG 24 hr capsule Take 1 capsule by mouth daily.    . metoprolol succinate (TOPROL-XL) 25 MG 24 hr tablet Take 12.5 mg by mouth daily.    . Multiple Vitamin (MULTIVITAMIN) tablet Take 1 tablet by mouth daily.    . Multiple Vitamins-Minerals (PRESERVISION AREDS 2 PO) Take 1 tablet by mouth 2 (two) times daily.    . Omega-3 Fatty Acids (FISH OIL) 1000 MG CAPS Take 1 capsule by mouth daily.     No current facility-administered medications for this visit.      Past Medical History:  Diagnosis Date  . Adenoma 2003  . Colon polyps   . CTS (carpal tunnel syndrome)   . ED (erectile dysfunction)   . Hydrocele, right   . Palpitations   . SAH (subarachnoid hemorrhage) ([place]) 01/2006   L THALAMIC, NO REOCCURRENCE  . SVT (supraventricular tachycardia) (HCC)     ROS:   All systems reviewed and negative except as noted in the HPI.   Past Surgical History:  Procedure Laterality Date  . CYST REMOVAL NECK Right 05/2014   sebaceous cyst      Family History  Problem Relation Age of Onset  . Hypertension Mother   . Diabetes Father   . Diabetes Brother       Social History   Social History  . Marital status: Married    Spouse name: susan  . Number of children: 2  . Years of education: 12   Occupational History  . retired    Social History Main Topics  . Smoking status: Never Smoker  . Smokeless tobacco: Never Used  . Alcohol use No  . Drug use: No  . Sexual activity: Not on file   Other Topics Concern  . Not on file   Social History Narrative  . No narrative on file     BP 130/70   Pulse (!) 53   Ht 6\' 2"  (1.88 m)   Wt 222 lb (100.7 kg)   BMI 28.50 kg/m   Physical Exam:  Well appearing NAD HEENT: Unremarkable Neck:  No JVD, no thyromegally Lymphatics:  No adenopathy Back:  No CVA tenderness Lungs:  Clear with no wheezes HEART:  Regular rate rhythm, no murmurs, no rubs, no clicks Abd:  soft, positive bowel sounds, no organomegally, no rebound, no guarding Ext:  2 plus pulses, no edema, no cyanosis, no clubbing Skin:  No rashes no nodules Neuro:  CN II through XII intact, motor grossly intact  EKG - NSR with no pre-excitation  Assess/Plan:  1. SVT - I have discussed the treatment options with the patient and have recommended proceeding with EP study and catheter ablation of SVT. The risks/benefits/goals/expectations of the procedure have been reviewed and he wishes to proceed. He will call to schedule a time. 2. HTN - after ablation, he may still require blood pressure lowering meds.  Mikle Bosworth.D.

## 2016-03-08 ENCOUNTER — Telehealth: Payer: Self-pay | Admitting: Internal Medicine

## 2016-03-08 DIAGNOSIS — I471 Supraventricular tachycardia: Secondary | ICD-10-CM

## 2016-03-08 NOTE — Telephone Encounter (Signed)
9/25--SVT ablation at 7:30am 9/18--labs at 11:00am  Patient to be at the [facility] at 5:30am on 9/25 NPO after midnight No medications the morning of the test

## 2016-03-08 NOTE — Telephone Encounter (Signed)
New Message  Pt call requesting to speak with RN about scheduling a procedure. Please call back to discuss

## 2016-03-29 ENCOUNTER — Other Ambulatory Visit: Payer: PPO | Admitting: *Deleted

## 2016-03-29 DIAGNOSIS — I471 Supraventricular tachycardia: Secondary | ICD-10-CM

## 2016-03-29 LAB — CBC WITH DIFFERENTIAL/PLATELET
BASOS PCT: 1 %
Basophils Absolute: 48 cells/uL (ref 0–200)
EOS PCT: 4 %
Eosinophils Absolute: 192 cells/uL (ref 15–500)
HEMATOCRIT: 40.7 % (ref 38.5–50.0)
HEMOGLOBIN: 13.9 g/dL (ref 13.2–17.1)
LYMPHS ABS: 2016 {cells}/uL (ref 850–3900)
Lymphocytes Relative: 42 %
MCH: 32.6 pg (ref 27.0–33.0)
MCHC: 34.2 g/dL (ref 32.0–36.0)
MCV: 95.3 fL (ref 80.0–100.0)
MONOS PCT: 10 %
MPV: 9.2 fL (ref 7.5–12.5)
Monocytes Absolute: 480 cells/uL (ref 200–950)
NEUTROS ABS: 2064 {cells}/uL (ref 1500–7800)
Neutrophils Relative %: 43 %
PLATELETS: 155 10*3/uL (ref 140–400)
RBC: 4.27 MIL/uL (ref 4.20–5.80)
RDW: 13.1 % (ref 11.0–15.0)
WBC: 4.8 10*3/uL (ref 3.8–10.8)

## 2016-03-29 LAB — BASIC METABOLIC PANEL
BUN: 17 mg/dL (ref 7–25)
CALCIUM: 8.7 mg/dL (ref 8.6–10.3)
CO2: 27 mmol/L (ref 20–31)
Chloride: 106 mmol/L (ref 98–110)
Creat: 0.75 mg/dL (ref 0.70–1.25)
Glucose, Bld: 82 mg/dL (ref 65–99)
Potassium: 4.3 mmol/L (ref 3.5–5.3)
SODIUM: 139 mmol/L (ref 135–146)

## 2016-03-30 DIAGNOSIS — Z23 Encounter for immunization: Secondary | ICD-10-CM | POA: Diagnosis not present

## 2016-04-05 ENCOUNTER — Ambulatory Visit (HOSPITAL_COMMUNITY)
Admission: RE | Admit: 2016-04-05 | Discharge: 2016-04-05 | Disposition: A | Discharge status: Home / Self Care | Payer: PPO | Source: Ambulatory Visit | Attending: Internal Medicine | Admitting: Internal Medicine

## 2016-04-05 ENCOUNTER — Encounter (HOSPITAL_COMMUNITY): Payer: Self-pay | Admitting: General Practice

## 2016-04-05 ENCOUNTER — Encounter (HOSPITAL_COMMUNITY)
Admission: RE | Disposition: A | Discharge status: Home / Self Care | Payer: Self-pay | Source: Ambulatory Visit | Attending: Internal Medicine

## 2016-04-05 DIAGNOSIS — I10 Essential (primary) hypertension: Secondary | ICD-10-CM | POA: Diagnosis not present

## 2016-04-05 DIAGNOSIS — I471 Supraventricular tachycardia: Secondary | ICD-10-CM | POA: Diagnosis not present

## 2016-04-05 DIAGNOSIS — Z79899 Other long term (current) drug therapy: Secondary | ICD-10-CM | POA: Insufficient documentation

## 2016-04-05 HISTORY — PX: ELECTROPHYSIOLOGIC STUDY: SHX172A

## 2016-04-05 HISTORY — PX: ABLATION OF DYSRHYTHMIC FOCUS: SHX254

## 2016-04-05 SURGERY — A-FLUTTER/A-TACH/SVT ABLATION

## 2016-04-05 MED ORDER — MIDAZOLAM HCL 5 MG/5ML IJ SOLN
INTRAMUSCULAR | Status: AC
  Filled 2016-04-05: qty 5

## 2016-04-05 MED ORDER — METOPROLOL SUCCINATE ER 25 MG PO TB24
12.5000 mg | ORAL_TABLET | Freq: Every day | ORAL | Status: DC
  Administered 2016-04-05: 12.5 mg via ORAL
  Filled 2016-04-05: qty 1

## 2016-04-05 MED ORDER — DILTIAZEM HCL ER COATED BEADS 180 MG PO CP24: 180.0000 mg | ORAL_CAPSULE | Freq: Every day | ORAL | 3 refills | Status: DC

## 2016-04-05 MED ORDER — SODIUM CHLORIDE 0.9 % IV SOLN
INTRAVENOUS | Status: DC | PRN
  Administered 2016-04-05: 4 ug/min via INTRAVENOUS

## 2016-04-05 MED ORDER — ONDANSETRON HCL 4 MG/2ML IJ SOLN: 4.0000 mg | Freq: Four times a day (QID) | INTRAMUSCULAR | Status: DC | PRN

## 2016-04-05 MED ORDER — FENTANYL CITRATE (PF) 100 MCG/2ML IJ SOLN
INTRAMUSCULAR | Status: AC
  Filled 2016-04-05: qty 2

## 2016-04-05 MED ORDER — FENTANYL CITRATE (PF) 100 MCG/2ML IJ SOLN
INTRAMUSCULAR | Status: DC | PRN
  Administered 2016-04-05 (×2): 25 ug via INTRAVENOUS
  Administered 2016-04-05 (×4): 12.5 ug via INTRAVENOUS

## 2016-04-05 MED ORDER — ISOPROTERENOL HCL 0.2 MG/ML IJ SOLN
INTRAMUSCULAR | Status: AC
  Filled 2016-04-05: qty 5

## 2016-04-05 MED ORDER — ADULT MULTIVITAMIN W/MINERALS CH
1.0000 | ORAL_TABLET | Freq: Every day | ORAL | Status: DC
Start: 2016-04-05 — End: 2016-04-05
  Administered 2016-04-05: 1 via ORAL
  Filled 2016-04-05: qty 1

## 2016-04-05 MED ORDER — VITAMIN C 500 MG PO TABS
1000.0000 mg | ORAL_TABLET | Freq: Every day | ORAL | Status: DC
Start: 2016-04-06 — End: 2016-04-05
  Filled 2016-04-05: qty 2

## 2016-04-05 MED ORDER — ACETAMINOPHEN 325 MG PO TABS: 650.0000 mg | ORAL_TABLET | ORAL | Status: DC | PRN

## 2016-04-05 MED ORDER — MIDAZOLAM HCL 5 MG/5ML IJ SOLN
INTRAMUSCULAR | Status: DC | PRN
  Administered 2016-04-05: 1 mg via INTRAVENOUS
  Administered 2016-04-05: 2 mg via INTRAVENOUS
  Administered 2016-04-05 (×3): 1 mg via INTRAVENOUS
  Administered 2016-04-05: 2 mg via INTRAVENOUS

## 2016-04-05 MED ORDER — HEPARIN (PORCINE) IN NACL 2-0.9 UNIT/ML-% IJ SOLN
INTRAMUSCULAR | Status: DC | PRN
  Administered 2016-04-05: 09:00:00

## 2016-04-05 MED ORDER — SODIUM CHLORIDE 0.9 % IV SOLN
INTRAVENOUS | Status: DC
  Administered 2016-04-05: 06:00:00 via INTRAVENOUS

## 2016-04-05 MED ORDER — SODIUM CHLORIDE 0.9% FLUSH: 3.0000 mL | Freq: Two times a day (BID) | INTRAVENOUS | Status: DC

## 2016-04-05 MED ORDER — SODIUM CHLORIDE 0.9% FLUSH: 3.0000 mL | INTRAVENOUS | Status: DC | PRN

## 2016-04-05 MED ORDER — CRANBERRY 300 MG PO TABS: 300.0000 mg | ORAL_TABLET | Freq: Every day | ORAL | Status: DC

## 2016-04-05 MED ORDER — DILTIAZEM HCL ER BEADS 120 MG PO CP24
360.0000 mg | ORAL_CAPSULE | Freq: Every day | ORAL | Status: DC
  Filled 2016-04-05: qty 1

## 2016-04-05 MED ORDER — BUPIVACAINE HCL (PF) 0.25 % IJ SOLN
INTRAMUSCULAR | Status: DC | PRN
  Administered 2016-04-05: 31 mL

## 2016-04-05 MED ORDER — BUPIVACAINE HCL (PF) 0.25 % IJ SOLN
INTRAMUSCULAR | Status: AC
  Filled 2016-04-05: qty 60

## 2016-04-05 MED ORDER — HEPARIN (PORCINE) IN NACL 2-0.9 UNIT/ML-% IJ SOLN
INTRAMUSCULAR | Status: AC
  Filled 2016-04-05: qty 500

## 2016-04-05 MED ORDER — SODIUM CHLORIDE 0.9 % IV SOLN: 250.0000 mL | INTRAVENOUS | Status: DC | PRN

## 2016-04-05 SURGICAL SUPPLY — 11 items
BAG SNAP BAND KOVER 36X36 (MISCELLANEOUS) ×2 IMPLANT
CATH CELSIUS THERM D CV 7F (ABLATOR) ×2 IMPLANT
CATH HEX JOS 2-5-2 65CM 6F REP (CATHETERS) ×2 IMPLANT
CATH JOSEPH QUAD ALLRED 6F REP (CATHETERS) ×4 IMPLANT
PACK EP LATEX FREE (CUSTOM PROCEDURE TRAY) ×3
PACK EP LF (CUSTOM PROCEDURE TRAY) IMPLANT
PAD DEFIB LIFELINK (PAD) ×2 IMPLANT
SHEATH PINNACLE 6F 10CM (SHEATH) ×4 IMPLANT
SHEATH PINNACLE 7F 10CM (SHEATH) ×2 IMPLANT
SHEATH PINNACLE 8F 10CM (SHEATH) ×2 IMPLANT
SHIELD RADPAD SCOOP 12X17 (MISCELLANEOUS) ×2 IMPLANT

## 2016-04-05 NOTE — H&P (Signed)
HPI Joshua Poole is referred today by Dr. Nyoka Cowden for evaluation of SVT. He is a pleasant 64 yo man who has had SVT his entire adult life. He has been to the ER with HR's in the 180 range, terminated by vagal manuevers. He is on Cardizem and most recently metoprolol. He feels fatigue and lethargy from the metoprolol. He also notes PAC's. He denies caffeine or ETOH use/abuse. No syncope. In SVT he feels bad and notes sob. The episodes start and stop suddenly. He has documented short RP tachycardia at 176/min. No Known Allergies         Current Outpatient Prescriptions  Medication Sig Dispense Refill  . Ascorbic Acid (VITAMIN C) 1000 MG tablet Take 1,000 mg by mouth daily.    . cetirizine (KLS ALLER-TEC) 10 MG tablet Take 10 mg by mouth daily.    . Cranberry (SM CRANBERRY) 300 MG tablet Take 300 mg by mouth daily.    Marland Kitchen diltiazem (TIAZAC) 360 MG 24 hr capsule Take 1 capsule by mouth daily.    . metoprolol succinate (TOPROL-XL) 25 MG 24 hr tablet Take 12.5 mg by mouth daily.    . Multiple Vitamin (MULTIVITAMIN) tablet Take 1 tablet by mouth daily.    . Multiple Vitamins-Minerals (PRESERVISION AREDS 2 PO) Take 1 tablet by mouth 2 (two) times daily.    . Omega-3 Fatty Acids (FISH OIL) 1000 MG CAPS Take 1 capsule by mouth daily.     No current facility-administered medications for this visit.          Past Medical History:  Diagnosis Date  . Adenoma 2003  . Colon polyps   . CTS (carpal tunnel syndrome)   . ED (erectile dysfunction)   . Hydrocele, right   . Palpitations   . SAH (subarachnoid hemorrhage) ([place]) 01/2006   L THALAMIC, NO REOCCURRENCE  . SVT (supraventricular tachycardia) (HCC)     ROS:   All systems reviewed and negative except as noted in the HPI.        Past Surgical History:  Procedure Laterality Date  . CYST REMOVAL NECK Right 05/2014   sebaceous cyst           Family History  Problem Relation Age of Onset  . Hypertension  Mother   . Diabetes Father   . Diabetes Brother      Social History        Social History  . Marital status: Married    Spouse name: susan  . Number of children: 2  . Years of education: 12       Occupational History  . retired        Social History Main Topics  . Smoking status: Never Smoker  . Smokeless tobacco: Never Used  . Alcohol use No  . Drug use: No  . Sexual activity: Not on file       Other Topics Concern  . Not on file      Social History Narrative  . No narrative on file     BP 130/70   Pulse (!) 53   Ht 6\' 2"  (1.88 m)   Wt 222 lb (100.7 kg)   BMI 28.50 kg/m   Physical Exam:  Well appearing NAD HEENT: Unremarkable Neck:  No JVD, no thyromegally Lymphatics:  No adenopathy Back:  No CVA tenderness Lungs:  Clear with no wheezes HEART:  Regular rate rhythm, no murmurs, no rubs, no clicks Abd:  soft, positive bowel sounds, no organomegally, no rebound, no guarding  Ext:  2 plus pulses, no edema, no cyanosis, no clubbing Skin:  No rashes no nodules Neuro:  CN II through XII intact, motor grossly intact  EKG - NSR with no pre-excitation  Assess/Plan: 1. SVT - I have discussed the treatment options with the patient and have recommended proceeding with EP study and catheter ablation of SVT. The risks/benefits/goals/expectations of the procedure have been reviewed and he wishes to proceed. He will call to schedule a time. 2. HTN - after ablation, he may still require blood pressure lowering meds.  Mikle Bosworth.D

## 2016-04-05 NOTE — Interval H&P Note (Signed)
History and Physical Interval Note:  04/05/2016 8:07 AM  Joshua Poole  has presented today for surgery, with the diagnosis of svt  The various methods of treatment have been discussed with the patient and family. After consideration of risks, benefits and other options for treatment, the patient has consented to  Procedure(s): SVT Ablation (N/A) as a surgical intervention .  The patient's history has been reviewed, patient examined, no change in status, stable for surgery.  I have reviewed the patient's chart and labs.  Questions were answered to the patient's satisfaction.     [name]

## 2016-04-05 NOTE — Progress Notes (Signed)
Patient is seen and examined by Dr. Lovena Le, reported mild dizziness upon first getting OOB earlier, feels well currently and is cleared to discharge once able to ambulate without symptoms (d/w RN, will call the service if he has recurrent lightheaded/dizziness with OOB) VSS Telemetry is SB 48-60's Procedure sites stable We will discontinue his home diltiazem and continue metoprolol unchanged Activity restrictions were discussed with the patient F/u has been arranged.  Tommye Standard, PA-C  EP Attending  Patient seen and examined. Agree with above. He is stable for DC home. Usual followup.  Mikle Bosworth.D.

## 2016-04-05 NOTE — Progress Notes (Addendum)
Site area: Right groin a 6 french sheath X2, and a 8 french venous sheath was removed  Site Prior to Removal:  Level 0  Pressure Applied For 15 MINUTES    Bedrest Beginning at 1005a  Manual:   Yes.    Patient Status During Pull:  stable  Post Pull Groin Site:  Level 0  Post Pull Instructions Given:  Yes.    Post Pull Pulses Present:  Yes.    Dressing Applied:  Yes.    Comments:  VS remain stable during sheath

## 2016-04-05 NOTE — Progress Notes (Addendum)
Site area: Right Internal Jugular a 7 french venous sheath was removed  Site Prior to Removal:  Level 0  Pressure Applied For 10 MINUTES    Minutes Beginning at 1005am.  Manual:   Yes.    Patient Status During Pull:  stable  Post Pull Groin Site:  Level 0  Post Pull Instructions Given:  Yes.    Post Pull Pulses Present:  Yes.    Dressing Applied:  Yes.    Comments:  Pt VS remain stable during sheath.

## 2016-04-05 NOTE — Discharge Instructions (Signed)
No driving for  1 week. No lifting over 5 lbs for 1 week. No vigorous or sexual activity for 1 week. You may return to work on 04/11/16 . Keep procedure sites clean & dry. If you notice increased pain, swelling, bleeding or pus, call/return!  You may shower, but no soaking baths/hot tubs/pools for 1 week.

## 2016-04-05 NOTE — Care Management Note (Signed)
Case Management Note  Patient Details  Name: Joshua Poole MRN: [id number] Date of Birth: 11-18-1950  Subjective/Objective:   SVT ablation, NCM will cont to follow for dc needs.                  Action/Plan:   Expected Discharge Date:                  Expected Discharge Plan:  Home/Self Care  In-House Referral:     Discharge planning Services  CM Consult  Post Acute Care Choice:    Choice offered to:     DME Arranged:    DME Agency:     HH Arranged:    HH Agency:     Status of Service:  In process, will continue to follow  If discussed at Long Length of Stay Meetings, dates discussed:    Additional Comments:  Zenon Mayo, RN 04/05/2016, 3:39 PM

## 2016-04-06 ENCOUNTER — Encounter (HOSPITAL_COMMUNITY): Payer: Self-pay | Admitting: Internal Medicine

## 2016-05-10 ENCOUNTER — Encounter: Payer: Self-pay | Admitting: Internal Medicine

## 2016-05-10 ENCOUNTER — Ambulatory Visit (INDEPENDENT_AMBULATORY_CARE_PROVIDER_SITE_OTHER): Payer: PPO | Admitting: Internal Medicine

## 2016-05-10 VITALS — BP 126/80 | HR 53 | Ht 74.0 in | Wt 223.1 lb

## 2016-05-10 DIAGNOSIS — I471 Supraventricular tachycardia: Secondary | ICD-10-CM | POA: Diagnosis not present

## 2016-05-10 NOTE — Progress Notes (Signed)
HPI Mr. Muffler returns today for evaluation of SVT, s/p SVT ablation. He is a pleasant 65 yo man who has had SVT his entire adult life. He was treated with multiple medications and ultimately underwent EP study and catheter ablation about 8 weeks ago. In the interim, he has done well. He notes that he still feels at times like his heart is about to start racing but it does not.   No Known Allergies   Current Outpatient Prescriptions  Medication Sig Dispense Refill  . Ascorbic Acid (VITAMIN C) 1000 MG tablet Take 1,000 mg by mouth daily.    . cetirizine (KLS ALLER-TEC) 10 MG tablet Take 10 mg by mouth daily.    . Cranberry (SM CRANBERRY) 300 MG tablet Take 300 mg by mouth daily.    . metoprolol succinate (TOPROL-XL) 25 MG 24 hr tablet Take 12.5 mg by mouth daily.    . Multiple Vitamin (MULTIVITAMIN) tablet Take 1 tablet by mouth daily.    . Multiple Vitamins-Minerals (PRESERVISION AREDS 2 PO) Take 1 tablet by mouth 2 (two) times daily.    . Omega-3 Fatty Acids (FISH OIL) 1000 MG CAPS Take 1 capsule by mouth daily.     No current facility-administered medications for this visit.      Past Medical History:  Diagnosis Date  . Adenoma 2003  . Colon polyps   . CTS (carpal tunnel syndrome)   . ED (erectile dysfunction)   . Hydrocele, right   . Palpitations   . SAH (subarachnoid hemorrhage) ([place]) 01/2006   L THALAMIC, NO REOCCURRENCE  . SVT (supraventricular tachycardia) (HCC)     ROS:   All systems reviewed and negative except as noted in the HPI.   Past Surgical History:  Procedure Laterality Date  . ABLATION OF DYSRHYTHMIC FOCUS  04/05/2016  . CYST REMOVAL NECK Right 05/2014   sebaceous cyst   . ELECTROPHYSIOLOGIC STUDY N/A 04/05/2016   Procedure: SVT Ablation;  Surgeon: Evans Lance, MD;  Location: [place] [facility];  Service: Cardiovascular;  Laterality: N/A;     Family History  Problem Relation Age of Onset  . Hypertension Mother   . Diabetes Father   .  Diabetes Brother      Social History   Social History  . Marital status: Married    Spouse name: susan  . Number of children: 2  . Years of education: 12   Occupational History  . retired    Social History Main Topics  . Smoking status: Never Smoker  . Smokeless tobacco: Never Used  . Alcohol use No  . Drug use: No  . Sexual activity: Not on file   Other Topics Concern  . Not on file   Social History Narrative  . No narrative on file     BP 126/80   Pulse (!) 53   Ht 6\' 2"  (1.88 m)   Wt 223 lb 1.9 oz (101.2 kg)   BMI 28.65 kg/m   Physical Exam:  Well appearing 65 yo man, NAD HEENT: Unremarkable Neck:  6 cm JVD, no thyromegally Lymphatics:  No adenopathy Back:  6 cm CVA tenderness Lungs:  Clear with no wheezes HEART:  Regular rate rhythm, no murmurs, no rubs, no clicks Abd:  soft, positive bowel sounds, no organomegally, no rebound, no guarding Ext:  2 plus pulses, no edema, no cyanosis, no clubbing Skin:  No rashes no nodules Neuro:  CN II through XII intact, motor grossly intact  EKG -  NSR with no pre-excitation  Assess/Plan: 1. SVT - He has had no recurrent sustained SVT since his ablation. We will stop his low dose beta blocker and see how he does. 2. HTN - his blood pressure is good. If he has HTN now that we are stopping his beta blocker than he will need to contact his primary MD.  Mikle Bosworth.D.

## 2016-05-10 NOTE — Patient Instructions (Signed)
Medication Instructions:    Your physician recommends that you continue on your current medications as directed. Please refer to the Current Medication list given to you today.  --- If you need a refill on your cardiac medications before your next appointment, please call your pharmacy. ---  Labwork:  None ordered  Testing/Procedures:  None ordered  Follow-Up:  No follow up is needed at this time with Dr. Lovena Le.  He will see you on an as needed basis.  Thank you for choosing [facility]!!

## 2016-06-08 DIAGNOSIS — H3552 Pigmentary retinal dystrophy: Secondary | ICD-10-CM | POA: Diagnosis not present

## 2016-06-08 DIAGNOSIS — H25813 Combined forms of age-related cataract, bilateral: Secondary | ICD-10-CM | POA: Diagnosis not present

## 2016-06-08 DIAGNOSIS — H31091 Other chorioretinal scars, right eye: Secondary | ICD-10-CM | POA: Diagnosis not present

## 2016-06-15 DIAGNOSIS — H2511 Age-related nuclear cataract, right eye: Secondary | ICD-10-CM | POA: Diagnosis not present

## 2016-07-22 DIAGNOSIS — H2511 Age-related nuclear cataract, right eye: Secondary | ICD-10-CM | POA: Diagnosis not present

## 2016-08-02 DIAGNOSIS — H2512 Age-related nuclear cataract, left eye: Secondary | ICD-10-CM | POA: Diagnosis not present

## 2016-08-05 DIAGNOSIS — H25812 Combined forms of age-related cataract, left eye: Secondary | ICD-10-CM | POA: Diagnosis not present

## 2016-08-05 DIAGNOSIS — H2512 Age-related nuclear cataract, left eye: Secondary | ICD-10-CM | POA: Diagnosis not present

## 2016-12-07 ENCOUNTER — Encounter: Payer: Self-pay | Admitting: Gastroenterology

## 2017-01-24 ENCOUNTER — Encounter: Payer: Self-pay | Admitting: Gastroenterology

## 2017-01-24 DIAGNOSIS — Z6841 Body Mass Index (BMI) 40.0 and over, adult: Secondary | ICD-10-CM | POA: Diagnosis not present

## 2017-01-24 DIAGNOSIS — K635 Polyp of colon: Secondary | ICD-10-CM | POA: Diagnosis not present

## 2017-01-24 DIAGNOSIS — Z125 Encounter for screening for malignant neoplasm of prostate: Secondary | ICD-10-CM | POA: Diagnosis not present

## 2017-01-24 DIAGNOSIS — R8290 Unspecified abnormal findings in urine: Secondary | ICD-10-CM | POA: Diagnosis not present

## 2017-01-24 DIAGNOSIS — I82409 Acute embolism and thrombosis of unspecified deep veins of unspecified lower extremity: Secondary | ICD-10-CM | POA: Diagnosis not present

## 2017-01-24 DIAGNOSIS — R799 Abnormal finding of blood chemistry, unspecified: Secondary | ICD-10-CM | POA: Diagnosis not present

## 2017-01-24 DIAGNOSIS — Z0000 Encounter for general adult medical examination without abnormal findings: Secondary | ICD-10-CM | POA: Diagnosis not present

## 2017-03-03 ENCOUNTER — Ambulatory Visit (AMBULATORY_SURGERY_CENTER): Payer: Self-pay | Admitting: *Deleted

## 2017-03-03 ENCOUNTER — Encounter: Payer: Self-pay | Admitting: Gastroenterology

## 2017-03-03 VITALS — Ht 74.0 in | Wt 222.0 lb

## 2017-03-03 DIAGNOSIS — Z86010 Personal history of colonic polyps: Secondary | ICD-10-CM

## 2017-03-03 MED ORDER — NA SULFATE-K SULFATE-MG SULF 17.5-3.13-1.6 GM/177ML PO SOLN: 1.0000 [IU] | Freq: Once | ORAL | 0 refills | Status: AC

## 2017-03-03 NOTE — Progress Notes (Signed)
No egg or soy allergy known to patient  No issues with past sedation with any surgeries  or procedures, no intubation problems  No diet pills per patient No home 02 use per patient  No blood thinners per patient  Pt denies issues with constipation  No A fib or A flutter  EMMI video sent to pt's e mail  

## 2017-03-16 ENCOUNTER — Ambulatory Visit (AMBULATORY_SURGERY_CENTER): Payer: PPO | Admitting: Gastroenterology

## 2017-03-16 ENCOUNTER — Encounter: Payer: Self-pay | Admitting: Gastroenterology

## 2017-03-16 ENCOUNTER — Other Ambulatory Visit: Payer: Self-pay | Admitting: Gastroenterology

## 2017-03-16 VITALS — BP 118/64 | HR 47 | Temp 97.7°F | Resp 11 | Ht 74.0 in | Wt 223.0 lb

## 2017-03-16 DIAGNOSIS — Z1211 Encounter for screening for malignant neoplasm of colon: Secondary | ICD-10-CM | POA: Diagnosis not present

## 2017-03-16 DIAGNOSIS — D123 Benign neoplasm of transverse colon: Secondary | ICD-10-CM | POA: Diagnosis not present

## 2017-03-16 DIAGNOSIS — D126 Benign neoplasm of colon, unspecified: Secondary | ICD-10-CM | POA: Diagnosis not present

## 2017-03-16 DIAGNOSIS — D120 Benign neoplasm of cecum: Secondary | ICD-10-CM | POA: Diagnosis not present

## 2017-03-16 DIAGNOSIS — D127 Benign neoplasm of rectosigmoid junction: Secondary | ICD-10-CM | POA: Diagnosis not present

## 2017-03-16 DIAGNOSIS — D122 Benign neoplasm of ascending colon: Secondary | ICD-10-CM | POA: Diagnosis not present

## 2017-03-16 DIAGNOSIS — K635 Polyp of colon: Secondary | ICD-10-CM | POA: Diagnosis not present

## 2017-03-16 MED ORDER — SODIUM CHLORIDE 0.9 % IV SOLN: 500.0000 mL | INTRAVENOUS | Status: DC

## 2017-03-16 NOTE — Patient Instructions (Signed)
Discharge instructions given. Handouts on polyps,diverticulosis, and hemorrhoids. Resume previous medications. No ibuprofen, naproxen, or other NSAIDs for two weeks. YOU HAD AN ENDOSCOPIC PROCEDURE TODAY AT [facility]:   Refer to the procedure report that was given to you for any specific questions about what was found during the examination.  If the procedure report does not answer your questions, please call your gastroenterologist to clarify.  If you requested that your care partner not be given the details of your procedure findings, then the procedure report has been included in a sealed envelope for you to review at your convenience later.  YOU SHOULD EXPECT: Some feelings of bloating in the abdomen. Passage of more gas than usual.  Walking can help get rid of the air that was put into your GI tract during the procedure and reduce the bloating. If you had a lower endoscopy (such as a colonoscopy or flexible sigmoidoscopy) you may notice spotting of blood in your stool or on the toilet paper. If you underwent a bowel prep for your procedure, you may not have a normal bowel movement for a few days.  Please Note:  You might notice some irritation and congestion in your nose or some drainage.  This is from the oxygen used during your procedure.  There is no need for concern and it should clear up in a day or so.  SYMPTOMS TO REPORT IMMEDIATELY:   Following lower endoscopy (colonoscopy or flexible sigmoidoscopy):  Excessive amounts of blood in the stool  Significant tenderness or worsening of abdominal pains  Swelling of the abdomen that is new, acute  Fever of 100F or higher   For urgent or emergent issues, a gastroenterologist can be reached at any hour by calling [phone].   DIET:  We do recommend a small meal at first, but then you may proceed to your regular diet.  Drink plenty of fluids but you should avoid alcoholic beverages for 24 hours.  ACTIVITY:  You  should plan to take it easy for the rest of today and you should NOT DRIVE or use heavy machinery until tomorrow (because of the sedation medicines used during the test).    FOLLOW UP: Our staff will call the number listed on your records the next business day following your procedure to check on you and address any questions or concerns that you may have regarding the information given to you following your procedure. If we do not reach you, we will leave a message.  However, if you are feeling well and you are not experiencing any problems, there is no need to return our call.  We will assume that you have returned to your regular daily activities without incident.  If any biopsies were taken you will be contacted by phone or by letter within the next 1-3 weeks.  Please call us at [phone] if you have not heard about the biopsies in 3 weeks.    SIGNATURES/CONFIDENTIALITY: You and/or your care partner have signed paperwork which will be entered into your electronic medical record.  These signatures attest to the fact that that the information above on your After Visit Summary has been reviewed and is understood.  Full responsibility of the confidentiality of this discharge information lies with you and/or your care-partner.

## 2017-03-16 NOTE — Progress Notes (Signed)
Called to room to assist during endoscopic procedure.  Patient ID and intended procedure confirmed with present staff. Received instructions for my participation in the procedure from the performing physician.  

## 2017-03-16 NOTE — Progress Notes (Signed)
A and O x3. Report to RN. Tolerated MAC anesthesia well.

## 2017-03-16 NOTE — Op Note (Signed)
[facility] Patient Name: Joshua Poole Procedure Date: 03/16/2017 10:58 AM MRN: [id number] Endoscopist: Remo Lipps P. Gilma Bessette MD, MD Age: 66 Referring MD:  Date of Birth: 1950-08-01 Gender: Male Account #: [id number] Procedure:                Colonoscopy Indications:              Screening for colorectal malignant neoplasm Medicines:                Monitored Anesthesia Care Procedure:                Pre-Anesthesia Assessment:                           - Prior to the procedure, a History and Physical                            was performed, and patient medications and                            allergies were reviewed. The patient's tolerance of                            previous anesthesia was also reviewed. The risks                            and benefits of the procedure and the sedation                            options and risks were discussed with the patient.                            All questions were answered, and informed consent                            was obtained. Prior Anticoagulants: The patient has                            taken no previous anticoagulant or antiplatelet                            agents. ASA Grade Assessment: II - A patient with                            mild systemic disease. After reviewing the risks                            and benefits, the patient was deemed in                            satisfactory condition to undergo the procedure.                           After obtaining informed consent, the colonoscope  was passed under direct vision. Throughout the                            procedure, the patient's blood pressure, pulse, and                            oxygen saturations were monitored continuously. The                            Colonoscope was introduced through the anus and                            advanced to the the cecum, identified by                            appendiceal orifice  and ileocecal valve. The                            colonoscopy was performed without difficulty. The                            patient tolerated the procedure well. The quality                            of the bowel preparation was good. The ileocecal                            valve, appendiceal orifice, and rectum were                            photographed. Scope In: 11:10:20 AM Scope Out: 11:42:20 AM Scope Withdrawal Time: 0 hours 26 minutes 30 seconds  Total Procedure Duration: 0 hours 32 minutes 0 seconds  Findings:                 The perianal and digital rectal examinations were                            normal.                           A single small angiodysplastic lesion was found in                            the cecum.                           A 18 to 20 mm polyp was found in the cecum along                            the back wall. The polyp was sessile. The polyp was                            removed with a piecemeal technique using a cold  snare. Resection and retrieval were complete.                           A 15 to 18 mm polyp was found in the ascending                            colon along the backside of a fold. The polyp was                            sessile. The polyp was removed with a piecemeal                            technique using a cold snare. Resection and                            retrieval were complete. Area just distal to the                            polyp was tattooed with an injection of Spot                            (carbon black).                           A 3 mm polyp was found in the hepatic flexure. The                            polyp was sessile. The polyp was removed with a                            cold snare. Resection and retrieval were complete.                           A 5 mm polyp was found in the recto-sigmoid colon.                            The polyp was sessile. The polyp was removed with  a                            cold snare. Resection and retrieval were complete.                           A few medium-mouthed diverticula were found in the                            sigmoid colon.                           Internal hemorrhoids were found during retroflexion.                           The exam was otherwise without abnormality. Complications:  No immediate complications. Estimated blood loss:                            Minimal. Estimated Blood Loss:     Estimated blood loss was minimal. Impression:               - A single colonic angiodysplastic lesion.                           - One 18 to 20 mm polyp in the cecum, removed                            piecemeal using a cold snare. Resected and                            retrieved.                           - One 15 to 18 mm polyp in the ascending colon,                            removed piecemeal using a cold snare. Resected and                            retrieved. Tattooed.                           - One 3 mm polyp at the hepatic flexure, removed                            with a cold snare. Resected and retrieved.                           - One 5 mm polyp at the recto-sigmoid colon,                            removed with a cold snare. Resected and retrieved.                           - Diverticulosis in the sigmoid colon.                           - Internal hemorrhoids.                           - The examination was otherwise normal. Recommendation:           - Patient has a contact number available for                            emergencies. The signs and symptoms of potential                            delayed complications were discussed with the  patient. Return to normal activities tomorrow.                            Written discharge instructions were provided to the                            patient.                           - Resume previous diet.                            - Continue present medications.                           - Await pathology results.                           - Repeat colonoscopy in 3-6 months for surveillance                            of large polypectomy sites.                           - No ibuprofen, naproxen, or other non-steroidal                            anti-inflammatory drugs for 2 weeks after polyp                            removal. Remo Lipps P. Bernisha Verma MD, MD 03/16/2017 11:48:48 AM This report has been signed electronically.

## 2017-03-17 ENCOUNTER — Telehealth: Payer: Self-pay

## 2017-03-17 NOTE — Telephone Encounter (Signed)
  Follow up Call-  Call back number 03/16/2017  Post procedure Call Back phone  # [phone]  Permission to leave phone message Yes  Some recent data might be hidden     Patient questions:  Do you have a fever, pain , or abdominal swelling? No. Pain Score  0 *  Have you tolerated food without any problems? Yes.    Have you been able to return to your normal activities? Yes.    Do you have any questions about your discharge instructions: Diet   No. Medications  No. Follow up visit  No.  Do you have questions or concerns about your Care? No.  Actions: * If pain score is 4 or above: No action needed, pain <4.

## 2017-03-21 ENCOUNTER — Encounter: Payer: Self-pay | Admitting: Gastroenterology

## 2017-03-29 DIAGNOSIS — Z23 Encounter for immunization: Secondary | ICD-10-CM | POA: Diagnosis not present

## 2017-04-21 DIAGNOSIS — Z1283 Encounter for screening for malignant neoplasm of skin: Secondary | ICD-10-CM | POA: Diagnosis not present

## 2017-06-07 DIAGNOSIS — S81802A Unspecified open wound, left lower leg, initial encounter: Secondary | ICD-10-CM | POA: Diagnosis not present

## 2017-06-07 DIAGNOSIS — L723 Sebaceous cyst: Secondary | ICD-10-CM | POA: Diagnosis not present

## 2017-06-08 DIAGNOSIS — L723 Sebaceous cyst: Secondary | ICD-10-CM | POA: Diagnosis not present

## 2017-06-08 DIAGNOSIS — B432 Subcutaneous pheomycotic abscess and cyst: Secondary | ICD-10-CM | POA: Diagnosis not present

## 2017-06-14 DIAGNOSIS — L723 Sebaceous cyst: Secondary | ICD-10-CM | POA: Diagnosis not present

## 2017-07-13 ENCOUNTER — Encounter: Payer: Self-pay | Admitting: Gastroenterology

## 2017-07-18 DIAGNOSIS — L723 Sebaceous cyst: Secondary | ICD-10-CM | POA: Diagnosis not present

## 2017-07-20 ENCOUNTER — Encounter: Payer: Self-pay | Admitting: Gastroenterology

## 2017-08-24 ENCOUNTER — Other Ambulatory Visit: Payer: Self-pay

## 2017-08-24 ENCOUNTER — Ambulatory Visit (AMBULATORY_SURGERY_CENTER): Payer: Self-pay | Admitting: *Deleted

## 2017-08-24 VITALS — Ht 74.0 in | Wt 220.4 lb

## 2017-08-24 DIAGNOSIS — Z86010 Personal history of colonic polyps: Secondary | ICD-10-CM

## 2017-08-24 MED ORDER — NA SULFATE-K SULFATE-MG SULF 17.5-3.13-1.6 GM/177ML PO SOLN: 1.0000 | Freq: Once | ORAL | 0 refills | Status: AC

## 2017-08-24 NOTE — Progress Notes (Signed)
No egg or soy allergy known to patient  No issues with past sedation with any surgeries  or procedures, no intubation problems  No diet pills per patient No home 02 use per patient  No blood thinners per patient  Pt denies issues with constipation  No A fib or A flutter  EMMI video sent to pt's e mail pt decline, saw video in sept We gave a pay no more than 50 suprep coupon in [facility]

## 2017-08-25 ENCOUNTER — Encounter: Payer: Self-pay | Admitting: Gastroenterology

## 2017-09-02 DIAGNOSIS — Z961 Presence of intraocular lens: Secondary | ICD-10-CM | POA: Diagnosis not present

## 2017-09-02 DIAGNOSIS — H31091 Other chorioretinal scars, right eye: Secondary | ICD-10-CM | POA: Diagnosis not present

## 2017-09-02 DIAGNOSIS — H538 Other visual disturbances: Secondary | ICD-10-CM | POA: Diagnosis not present

## 2017-09-02 DIAGNOSIS — H3552 Pigmentary retinal dystrophy: Secondary | ICD-10-CM | POA: Diagnosis not present

## 2017-09-07 ENCOUNTER — Other Ambulatory Visit: Payer: Self-pay

## 2017-09-07 ENCOUNTER — Encounter: Payer: Self-pay | Admitting: Gastroenterology

## 2017-09-07 ENCOUNTER — Ambulatory Visit (AMBULATORY_SURGERY_CENTER): Payer: PPO | Admitting: Gastroenterology

## 2017-09-07 VITALS — BP 115/84 | HR 54 | Temp 98.0°F | Resp 10 | Ht 74.0 in | Wt 220.0 lb

## 2017-09-07 DIAGNOSIS — D122 Benign neoplasm of ascending colon: Secondary | ICD-10-CM | POA: Diagnosis not present

## 2017-09-07 DIAGNOSIS — D124 Benign neoplasm of descending colon: Secondary | ICD-10-CM | POA: Diagnosis not present

## 2017-09-07 DIAGNOSIS — Z86010 Personal history of colonic polyps: Secondary | ICD-10-CM | POA: Diagnosis not present

## 2017-09-07 DIAGNOSIS — D125 Benign neoplasm of sigmoid colon: Secondary | ICD-10-CM | POA: Diagnosis not present

## 2017-09-07 DIAGNOSIS — D123 Benign neoplasm of transverse colon: Secondary | ICD-10-CM | POA: Diagnosis not present

## 2017-09-07 DIAGNOSIS — K635 Polyp of colon: Secondary | ICD-10-CM

## 2017-09-07 DIAGNOSIS — Z1211 Encounter for screening for malignant neoplasm of colon: Secondary | ICD-10-CM | POA: Diagnosis not present

## 2017-09-07 MED ORDER — SODIUM CHLORIDE 0.9 % IV SOLN: 500.0000 mL | Freq: Once | INTRAVENOUS | Status: DC

## 2017-09-07 NOTE — Progress Notes (Signed)
Called to room to assist during endoscopic procedure.  Patient ID and intended procedure confirmed with present staff. Received instructions for my participation in the procedure from the performing physician.  

## 2017-09-07 NOTE — Op Note (Signed)
[facility] Patient Name: Joshua Poole Procedure Date: 09/07/2017 7:55 AM MRN: [id number] Endoscopist: Remo Lipps P. Lashea Goda MD, MD Age: 67 Referring MD:  Date of Birth: Nov 12, 1950 Gender: Male Account #: [id number] Procedure:                Colonoscopy Indications:              High risk colon cancer surveillance: Personal                            history of colonic polyps - 2 large polypectomies                            in the right colon 5-6 months ago, here for                            surveillance Medicines:                Monitored Anesthesia Care Procedure:                Pre-Anesthesia Assessment:                           - Prior to the procedure, a History and Physical                            was performed, and patient medications and                            allergies were reviewed. The patient's tolerance of                            previous anesthesia was also reviewed. The risks                            and benefits of the procedure and the sedation                            options and risks were discussed with the patient.                            All questions were answered, and informed consent                            was obtained. Prior Anticoagulants: The patient has                            taken no previous anticoagulant or antiplatelet                            agents. ASA Grade Assessment: III - A patient with                            severe systemic disease. After reviewing the risks  and benefits, the patient was deemed in                            satisfactory condition to undergo the procedure.                           After obtaining informed consent, the colonoscope                            was passed under direct vision. Throughout the                            procedure, the patient's blood pressure, pulse, and                            oxygen saturations were monitored continuously. The                             Colonoscope was introduced through the anus and                            advanced to the the cecum, identified by                            appendiceal orifice and ileocecal valve. The                            colonoscopy was technically difficult and complex                            due to significant looping. The patient tolerated                            the procedure well. The quality of the bowel                            preparation was adequate. The ileocecal valve,                            appendiceal orifice, and rectum were photographed. Scope In: 7:59:15 AM Scope Out: 8:30:27 AM Scope Withdrawal Time: 0 hours 20 minutes 9 seconds  Total Procedure Duration: 0 hours 31 minutes 12 seconds  Findings:                 The perianal and digital rectal examinations were                            normal.                           A single medium-sized angiodysplastic lesion was                            found in the cecum.  Two sessile polyps were found in the ascending                            colon. The polyps were diminutive in size. These                            polyps were removed with a cold biopsy forceps.                            Resection and retrieval were complete.                           A 5 mm polyp was found in the hepatic flexure. The                            polyp was flat and very subtle. The polyp was                            removed with a cold snare. Resection and retrieval                            were complete.                           A 3 mm polyp was found in the descending colon. The                            polyp was sessile. The polyp was removed with a                            cold snare. Resection and retrieval were complete.                           A 3 mm polyp was found in the sigmoid colon. The                            polyp was sessile. The polyp was removed with a                             cold snare. Resection and retrieval were complete.                           The colon was tortuous, cecal intubation required                            abdominal pressure.                           Internal hemorrhoids were found during retroflexion.                           There were 2 large polypectomy sites (cecum /  ascending colon) without any obvious residual polyp                            tissue. The exam was otherwise without abnormality. Complications:            No immediate complications. Estimated blood loss:                            Minimal. Estimated Blood Loss:     Estimated blood loss was minimal. Impression:               - Polypectomy sites x 2 without any obvious                            residual polyp tissue                           - A single colonic angiodysplastic lesion.                           - Two diminutive polyps in the ascending colon,                            removed with a cold biopsy forceps. Resected and                            retrieved.                           - One 5 mm polyp at the hepatic flexure, removed                            with a cold snare. Resected and retrieved.                           - One 3 mm polyp in the descending colon, removed                            with a cold snare. Resected and retrieved.                           - One 3 mm polyp in the sigmoid colon, removed with                            a cold snare. Resected and retrieved.                           - Tortuous colon.                           - Internal hemorrhoids.                           - The examination was otherwise normal. Recommendation:           - Patient has a contact number available for  emergencies. The signs and symptoms of potential                            delayed complications were discussed with the                            patient. Return to  normal activities tomorrow.                            Written discharge instructions were provided to the                            patient.                           - Resume previous diet.                           - Continue present medications.                           - Await pathology results.                           - Repeat colonoscopy in 3 years for surveillance. Remo Lipps P. Marv Alfrey MD, MD 09/07/2017 8:37:45 AM This report has been signed electronically.

## 2017-09-07 NOTE — Progress Notes (Signed)
I have reviewed the patient's medical history in detail and updated the computerized patient record.

## 2017-09-07 NOTE — Patient Instructions (Signed)
Handouts given : Polyps, Hemorrhoids.    YOU HAD AN ENDOSCOPIC PROCEDURE TODAY AT THE [facility]:   Refer to the procedure report that was given to you for any specific questions about what was found during the examination.  If the procedure report does not answer your questions, please call your gastroenterologist to clarify.  If you requested that your care partner not be given the details of your procedure findings, then the procedure report has been included in a sealed envelope for you to review at your convenience later.  YOU SHOULD EXPECT: Some feelings of bloating in the abdomen. Passage of more gas than usual.  Walking can help get rid of the air that was put into your GI tract during the procedure and reduce the bloating. If you had a lower endoscopy (such as a colonoscopy or flexible sigmoidoscopy) you may notice spotting of blood in your stool or on the toilet paper. If you underwent a bowel prep for your procedure, you may not have a normal bowel movement for a few days.  Please Note:  You might notice some irritation and congestion in your nose or some drainage.  This is from the oxygen used during your procedure.  There is no need for concern and it should clear up in a day or so.  SYMPTOMS TO REPORT IMMEDIATELY:   Following lower endoscopy (colonoscopy or flexible sigmoidoscopy):  Excessive amounts of blood in the stool  Significant tenderness or worsening of abdominal pains  Swelling of the abdomen that is new, acute  Fever of 100F or higher   For urgent or emergent issues, a gastroenterologist can be reached at any hour by calling [phone].   DIET:  We do recommend a small meal at first, but then you may proceed to your regular diet.  Drink plenty of fluids but you should avoid alcoholic beverages for 24 hours.  ACTIVITY:  You should plan to take it easy for the rest of today and you should NOT DRIVE or use heavy machinery until tomorrow (because of the  sedation medicines used during the test).    FOLLOW UP: Our staff will call the number listed on your records the next business day following your procedure to check on you and address any questions or concerns that you may have regarding the information given to you following your procedure. If we do not reach you, we will leave a message.  However, if you are feeling well and you are not experiencing any problems, there is no need to return our call.  We will assume that you have returned to your regular daily activities without incident.  If any biopsies were taken you will be contacted by phone or by letter within the next 1-3 weeks.  Please call us at [phone] if you have not heard about the biopsies in 3 weeks.    SIGNATURES/CONFIDENTIALITY: You and/or your care partner have signed paperwork which will be entered into your electronic medical record.  These signatures attest to the fact that that the information above on your After Visit Summary has been reviewed and is understood.  Full responsibility of the confidentiality of this discharge information lies with you and/or your care-partner. 

## 2017-09-07 NOTE — Progress Notes (Signed)
To PACU, VSS. Report to [email or web address] 

## 2017-09-08 ENCOUNTER — Telehealth: Payer: Self-pay

## 2017-09-08 NOTE — Telephone Encounter (Signed)
  Follow up Call-  Call back number 09/07/2017 03/16/2017  Post procedure Call Back phone  # [phone]  Permission to leave phone message Yes Yes  Some recent data might be hidden     Patient questions:  Do you have a fever, pain , or abdominal swelling? No. Pain Score  0 *  Have you tolerated food without any problems? Yes.    Have you been able to return to your normal activities? Yes.    Do you have any questions about your discharge instructions: Diet   No. Medications  No. Follow up visit  No.  Do you have questions or concerns about your Care? No.  Actions: * If pain score is 4 or above: No action needed, pain <4.

## 2017-09-14 ENCOUNTER — Encounter: Payer: Self-pay | Admitting: Gastroenterology

## 2018-01-25 DIAGNOSIS — Z125 Encounter for screening for malignant neoplasm of prostate: Secondary | ICD-10-CM | POA: Diagnosis not present

## 2018-01-25 DIAGNOSIS — Z6841 Body Mass Index (BMI) 40.0 and over, adult: Secondary | ICD-10-CM | POA: Diagnosis not present

## 2018-01-25 DIAGNOSIS — G5601 Carpal tunnel syndrome, right upper limb: Secondary | ICD-10-CM | POA: Diagnosis not present

## 2018-01-25 DIAGNOSIS — Z008 Encounter for other general examination: Secondary | ICD-10-CM | POA: Diagnosis not present

## 2018-01-25 DIAGNOSIS — K635 Polyp of colon: Secondary | ICD-10-CM | POA: Diagnosis not present

## 2018-01-25 DIAGNOSIS — I471 Supraventricular tachycardia: Secondary | ICD-10-CM | POA: Diagnosis not present

## 2018-03-23 DIAGNOSIS — Z23 Encounter for immunization: Secondary | ICD-10-CM | POA: Diagnosis not present

## 2018-09-01 DIAGNOSIS — Z961 Presence of intraocular lens: Secondary | ICD-10-CM | POA: Diagnosis not present

## 2018-09-01 DIAGNOSIS — H3552 Pigmentary retinal dystrophy: Secondary | ICD-10-CM | POA: Diagnosis not present

## 2018-09-01 DIAGNOSIS — H31091 Other chorioretinal scars, right eye: Secondary | ICD-10-CM | POA: Diagnosis not present

## 2018-10-16 DIAGNOSIS — M79604 Pain in right leg: Secondary | ICD-10-CM | POA: Diagnosis not present

## 2018-10-16 DIAGNOSIS — L0390 Cellulitis, unspecified: Secondary | ICD-10-CM | POA: Diagnosis not present

## 2018-10-16 DIAGNOSIS — F331 Major depressive disorder, recurrent, moderate: Secondary | ICD-10-CM | POA: Diagnosis not present

## 2018-10-16 DIAGNOSIS — H6002 Abscess of left external ear: Secondary | ICD-10-CM | POA: Diagnosis not present

## 2018-10-16 DIAGNOSIS — N63 Unspecified lump in unspecified breast: Secondary | ICD-10-CM | POA: Diagnosis not present

## 2018-10-16 DIAGNOSIS — I739 Peripheral vascular disease, unspecified: Secondary | ICD-10-CM | POA: Diagnosis not present

## 2018-10-16 DIAGNOSIS — E7800 Pure hypercholesterolemia, unspecified: Secondary | ICD-10-CM | POA: Diagnosis not present

## 2018-10-16 DIAGNOSIS — D649 Anemia, unspecified: Secondary | ICD-10-CM | POA: Diagnosis not present

## 2018-10-16 DIAGNOSIS — K219 Gastro-esophageal reflux disease without esophagitis: Secondary | ICD-10-CM | POA: Diagnosis not present

## 2018-10-16 DIAGNOSIS — R519 Headache, unspecified: Secondary | ICD-10-CM | POA: Diagnosis not present

## 2018-10-16 DIAGNOSIS — R945 Abnormal results of liver function studies: Secondary | ICD-10-CM | POA: Diagnosis not present

## 2018-10-16 DIAGNOSIS — R739 Hyperglycemia, unspecified: Secondary | ICD-10-CM | POA: Diagnosis not present

## 2018-10-16 DIAGNOSIS — E038 Other specified hypothyroidism: Secondary | ICD-10-CM | POA: Diagnosis not present

## 2018-10-16 DIAGNOSIS — F329 Major depressive disorder, single episode, unspecified: Secondary | ICD-10-CM | POA: Diagnosis not present

## 2018-10-16 DIAGNOSIS — M79605 Pain in left leg: Secondary | ICD-10-CM | POA: Diagnosis not present

## 2018-10-16 DIAGNOSIS — G4733 Obstructive sleep apnea (adult) (pediatric): Secondary | ICD-10-CM | POA: Diagnosis not present

## 2018-10-16 DIAGNOSIS — Z1231 Encounter for screening mammogram for malignant neoplasm of breast: Secondary | ICD-10-CM | POA: Diagnosis not present

## 2018-10-16 DIAGNOSIS — G629 Polyneuropathy, unspecified: Secondary | ICD-10-CM | POA: Diagnosis not present

## 2018-10-16 DIAGNOSIS — I482 Chronic atrial fibrillation, unspecified: Secondary | ICD-10-CM | POA: Diagnosis not present

## 2018-10-16 DIAGNOSIS — Z658 Other specified problems related to psychosocial circumstances: Secondary | ICD-10-CM | POA: Diagnosis not present

## 2018-10-16 DIAGNOSIS — I10 Essential (primary) hypertension: Secondary | ICD-10-CM | POA: Diagnosis not present

## 2018-10-16 DIAGNOSIS — F411 Generalized anxiety disorder: Secondary | ICD-10-CM | POA: Diagnosis not present

## 2018-10-19 DIAGNOSIS — H6002 Abscess of left external ear: Secondary | ICD-10-CM | POA: Diagnosis not present

## 2019-01-26 DIAGNOSIS — I10 Essential (primary) hypertension: Secondary | ICD-10-CM | POA: Diagnosis not present

## 2019-01-26 DIAGNOSIS — Z125 Encounter for screening for malignant neoplasm of prostate: Secondary | ICD-10-CM | POA: Diagnosis not present

## 2019-01-29 DIAGNOSIS — I471 Supraventricular tachycardia: Secondary | ICD-10-CM | POA: Diagnosis not present

## 2019-01-29 DIAGNOSIS — K635 Polyp of colon: Secondary | ICD-10-CM | POA: Diagnosis not present

## 2019-01-29 DIAGNOSIS — N433 Hydrocele, unspecified: Secondary | ICD-10-CM | POA: Diagnosis not present

## 2019-03-23 DIAGNOSIS — Z23 Encounter for immunization: Secondary | ICD-10-CM | POA: Diagnosis not present

## 2019-07-16 DIAGNOSIS — L02411 Cutaneous abscess of right axilla: Secondary | ICD-10-CM | POA: Diagnosis not present

## 2019-07-19 DIAGNOSIS — L02413 Cutaneous abscess of right upper limb: Secondary | ICD-10-CM | POA: Diagnosis not present

## 2019-07-23 DIAGNOSIS — L02411 Cutaneous abscess of right axilla: Secondary | ICD-10-CM | POA: Diagnosis not present

## 2019-08-06 DIAGNOSIS — E663 Overweight: Secondary | ICD-10-CM | POA: Diagnosis not present

## 2019-08-06 DIAGNOSIS — Z1331 Encounter for screening for depression: Secondary | ICD-10-CM | POA: Diagnosis not present

## 2019-08-06 DIAGNOSIS — K635 Polyp of colon: Secondary | ICD-10-CM | POA: Diagnosis not present

## 2019-08-06 DIAGNOSIS — Z8679 Personal history of other diseases of the circulatory system: Secondary | ICD-10-CM | POA: Diagnosis not present

## 2019-09-07 DIAGNOSIS — Z961 Presence of intraocular lens: Secondary | ICD-10-CM | POA: Diagnosis not present

## 2019-09-07 DIAGNOSIS — H3552 Pigmentary retinal dystrophy: Secondary | ICD-10-CM | POA: Diagnosis not present

## 2020-01-23 DIAGNOSIS — Z8679 Personal history of other diseases of the circulatory system: Secondary | ICD-10-CM | POA: Diagnosis not present

## 2020-01-23 DIAGNOSIS — R7989 Other specified abnormal findings of blood chemistry: Secondary | ICD-10-CM | POA: Diagnosis not present

## 2020-01-23 DIAGNOSIS — Z125 Encounter for screening for malignant neoplasm of prostate: Secondary | ICD-10-CM | POA: Diagnosis not present

## 2020-01-30 DIAGNOSIS — Z1331 Encounter for screening for depression: Secondary | ICD-10-CM | POA: Diagnosis not present

## 2020-01-30 DIAGNOSIS — R82998 Other abnormal findings in urine: Secondary | ICD-10-CM | POA: Diagnosis not present

## 2020-01-30 DIAGNOSIS — Z23 Encounter for immunization: Secondary | ICD-10-CM | POA: Diagnosis not present

## 2020-01-30 DIAGNOSIS — Z1389 Encounter for screening for other disorder: Secondary | ICD-10-CM | POA: Diagnosis not present

## 2020-01-30 DIAGNOSIS — Z0000 Encounter for general adult medical examination without abnormal findings: Secondary | ICD-10-CM | POA: Diagnosis not present

## 2020-01-30 DIAGNOSIS — Z1212 Encounter for screening for malignant neoplasm of rectum: Secondary | ICD-10-CM | POA: Diagnosis not present

## 2020-04-16 DIAGNOSIS — Z23 Encounter for immunization: Secondary | ICD-10-CM | POA: Diagnosis not present

## 2020-09-08 DIAGNOSIS — H3552 Pigmentary retinal dystrophy: Secondary | ICD-10-CM | POA: Diagnosis not present

## 2020-09-08 DIAGNOSIS — Z961 Presence of intraocular lens: Secondary | ICD-10-CM | POA: Diagnosis not present

## 2020-09-19 ENCOUNTER — Encounter: Payer: Self-pay | Admitting: Gastroenterology

## 2020-10-13 ENCOUNTER — Other Ambulatory Visit: Payer: Self-pay

## 2020-10-13 ENCOUNTER — Ambulatory Visit (AMBULATORY_SURGERY_CENTER): Payer: Self-pay | Admitting: *Deleted

## 2020-10-13 VITALS — Ht 74.0 in | Wt 213.0 lb

## 2020-10-13 DIAGNOSIS — Z86010 Personal history of colonic polyps: Secondary | ICD-10-CM

## 2020-10-13 MED ORDER — SUTAB 1479-225-188 MG PO TABS: 24.0000 | ORAL_TABLET | ORAL | 0 refills | Status: DC

## 2020-10-13 NOTE — Progress Notes (Signed)
No egg or soy allergy known to patient  No issues with past sedation with any surgeries or procedures Patient denies ever being told they had issues or difficulty with intubation  No FH of Malignant Hyperthermia No diet pills per patient No home 02 use per patient  No blood thinners per patient  Pt denies issues with constipation  No A fib or A flutter  EMMI video to pt or via [place] 19 guidelines implemented in [place] today with Pt and RN  Pt is fully vaccinated  for Covid  Wife on Pv today with Mr Joshua Poole  Coupon given to pt in [facility] today , Code to Pharmacy and  NO PA's for preps discussed with pt In [facility] today  Discussed with pt there will be an out-of-pocket cost for prep and that varies from $0 to 70 dollars   Due to the COVID-19 pandemic we are asking patients to follow certain guidelines.  Pt aware of COVID protocols and [facility] guidelines

## 2020-10-21 ENCOUNTER — Encounter: Payer: Self-pay | Admitting: Gastroenterology

## 2020-10-27 ENCOUNTER — Ambulatory Visit (AMBULATORY_SURGERY_CENTER): Payer: PPO | Admitting: Gastroenterology

## 2020-10-27 ENCOUNTER — Other Ambulatory Visit: Payer: Self-pay

## 2020-10-27 ENCOUNTER — Encounter: Payer: Self-pay | Admitting: Gastroenterology

## 2020-10-27 VITALS — BP 125/74 | HR 52 | Temp 98.0°F | Resp 10 | Ht 74.0 in | Wt 213.8 lb

## 2020-10-27 DIAGNOSIS — Z860100 Personal history of colon polyps, unspecified: Secondary | ICD-10-CM

## 2020-10-27 DIAGNOSIS — D128 Benign neoplasm of rectum: Secondary | ICD-10-CM

## 2020-10-27 DIAGNOSIS — K635 Polyp of colon: Secondary | ICD-10-CM

## 2020-10-27 DIAGNOSIS — D123 Benign neoplasm of transverse colon: Secondary | ICD-10-CM

## 2020-10-27 DIAGNOSIS — Z1211 Encounter for screening for malignant neoplasm of colon: Secondary | ICD-10-CM | POA: Diagnosis not present

## 2020-10-27 DIAGNOSIS — K621 Rectal polyp: Secondary | ICD-10-CM

## 2020-10-27 DIAGNOSIS — D125 Benign neoplasm of sigmoid colon: Secondary | ICD-10-CM

## 2020-10-27 DIAGNOSIS — D120 Benign neoplasm of cecum: Secondary | ICD-10-CM

## 2020-10-27 DIAGNOSIS — D127 Benign neoplasm of rectosigmoid junction: Secondary | ICD-10-CM | POA: Diagnosis not present

## 2020-10-27 MED ORDER — SODIUM CHLORIDE 0.9 % IV SOLN: 500.0000 mL | Freq: Once | INTRAVENOUS | Status: DC

## 2020-10-27 NOTE — Progress Notes (Signed)
A/ox3, pleased with MAC, report to RN 

## 2020-10-27 NOTE — Patient Instructions (Signed)
YOU HAD AN ENDOSCOPIC PROCEDURE TODAY AT THE [facility]:   Refer to the procedure report that was given to you for any specific questions about what was found during the examination.  If the procedure report does not answer your questions, please call your gastroenterologist to clarify.  If you requested that your care partner not be given the details of your procedure findings, then the procedure report has been included in a sealed envelope for you to review at your convenience later.  YOU SHOULD EXPECT: Some feelings of bloating in the abdomen. Passage of more gas than usual.  Walking can help get rid of the air that was put into your GI tract during the procedure and reduce the bloating. If you had a lower endoscopy (such as a colonoscopy or flexible sigmoidoscopy) you may notice spotting of blood in your stool or on the toilet paper. If you underwent a bowel prep for your procedure, you may not have a normal bowel movement for a few days.  Please Note:  You might notice some irritation and congestion in your nose or some drainage.  This is from the oxygen used during your procedure.  There is no need for concern and it should clear up in a day or so.  SYMPTOMS TO REPORT IMMEDIATELY:   Following lower endoscopy (colonoscopy or flexible sigmoidoscopy):  Excessive amounts of blood in the stool  Significant tenderness or worsening of abdominal pains  Swelling of the abdomen that is new, acute  Fever of 100F or higher   Following upper endoscopy (EGD)  Vomiting of blood or coffee ground material  New chest pain or pain under the shoulder blades  Painful or persistently difficult swallowing  New shortness of breath  Fever of 100F or higher  Black, tarry-looking stools  For urgent or emergent issues, a gastroenterologist can be reached at any hour by calling [phone]. Do not use MyChart messaging for urgent concerns.    DIET:  We do recommend a small meal at first, but  then you may proceed to your regular diet.  Drink plenty of fluids but you should avoid alcoholic beverages for 24 hours.  ACTIVITY:  You should plan to take it easy for the rest of today and you should NOT DRIVE or use heavy machinery until tomorrow (because of the sedation medicines used during the test).    FOLLOW UP: Our staff will call the number listed on your records 48-72 hours following your procedure to check on you and address any questions or concerns that you may have regarding the information given to you following your procedure. If we do not reach you, we will leave a message.  We will attempt to reach you two times.  During this call, we will ask if you have developed any symptoms of COVID 19. If you develop any symptoms (ie: fever, flu-like symptoms, shortness of breath, cough etc.) before then, please call [phone].  If you test positive for Covid 19 in the 2 weeks post procedure, please call and report this information to us.    If any biopsies were taken you will be contacted by phone or by letter within the next 1-3 weeks.  Please call us at [phone] if you have not heard about the biopsies in 3 weeks.    SIGNATURES/CONFIDENTIALITY: You and/or your care partner have signed paperwork which will be entered into your electronic medical record.  These signatures attest to the fact that that the information above on   your After Visit Summary has been reviewed and is understood.  Full responsibility of the confidentiality of this discharge information lies with you and/or your care-partner. 

## 2020-10-27 NOTE — Op Note (Signed)
[facility] Patient Name: Joshua Poole Procedure Date: 10/27/2020 10:52 AM MRN: [id number] Endoscopist: Remo Lipps P. Havery Moros , MD Age: 70 Referring MD:  Date of Birth: [birth year] Gender: Male Account #: [id number] Procedure:                Colonoscopy Indications:              High risk colon cancer surveillance: Personal                            history of colonic polyps (multiple advanced polyps                            in 2018) Medicines:                Monitored Anesthesia Care Procedure:                Pre-Anesthesia Assessment:                           - Prior to the procedure, a History and Physical                            was performed, and patient medications and                            allergies were reviewed. The patient's tolerance of                            previous anesthesia was also reviewed. The risks                            and benefits of the procedure and the sedation                            options and risks were discussed with the patient.                            All questions were answered, and informed consent                            was obtained. Prior Anticoagulants: The patient has                            taken no previous anticoagulant or antiplatelet                            agents. ASA Grade Assessment: II - A patient with                            mild systemic disease. After reviewing the risks                            and benefits, the patient was deemed in  satisfactory condition to undergo the procedure.                           After obtaining informed consent, the colonoscope                            was passed under direct vision. Throughout the                            procedure, the patient's blood pressure, pulse, and                            oxygen saturations were monitored continuously. The                            Olympus [id number]) Colonoscope was                             introduced through the anus and advanced to the the                            cecum, identified by appendiceal orifice and                            ileocecal valve. The colonoscopy was performed                            without difficulty. The patient tolerated the                            procedure well. The quality of the bowel                            preparation was adequate. The ileocecal valve,                            appendiceal orifice, and rectum were photographed. Scope In: 10:58:35 AM Scope Out: 11:24:40 AM Scope Withdrawal Time: 0 hours 16 minutes 19 seconds  Total Procedure Duration: 0 hours 26 minutes 5 seconds  Findings:                 The perianal and digital rectal examinations were                            normal.                           A single medium-sized angiodysplastic lesion was                            found in the cecum.                           Two flat polyps were found in the cecum. The polyps  were 3 to 6 mm in size. These polyps were removed                            with a cold snare. Resection and retrieval were                            complete.                           A 10 mm polyp was found in the transverse colon.                            The polyp was flat. The polyp was removed with a                            cold snare. Resection and retrieval were complete.                           Two sessile polyps were found in the sigmoid colon.                            The polyps were 3 to 4 mm in size. These polyps                            were removed with a cold snare. Resection and                            retrieval were complete.                           Two sessile polyps were found in the rectum. The                            polyps were 3 mm in size. These polyps were removed                            with a cold snare. Resection and retrieval were                             complete.                           Internal hemorrhoids were found during                            retroflexion. The hemorrhoids were moderate.                           The exam was otherwise without abnormality. Complications:            No immediate complications. Estimated blood loss:                            Minimal. Estimated Blood Loss:  Estimated blood loss was minimal. Impression:               - A single colonic angiodysplastic lesion.                           - Two 3 to 6 mm polyps in the cecum, removed with a                            cold snare. Resected and retrieved.                           - One 10 mm polyp in the transverse colon, removed                            with a cold snare. Resected and retrieved.                           - Two 3 to 4 mm polyps in the sigmoid colon,                            removed with a cold snare. Resected and retrieved.                           - Two 3 mm polyps in the rectum, removed with a                            cold snare. Resected and retrieved.                           - Internal hemorrhoids.                           - The examination was otherwise normal. Recommendation:           - Patient has a contact number available for                            emergencies. The signs and symptoms of potential                            delayed complications were discussed with the                            patient. Return to normal activities tomorrow.                            Written discharge instructions were provided to the                            patient.                           - Resume previous diet.                           -  Continue present medications.                           - Await pathology results. Remo Lipps P. Ahnyla Mendel, MD 10/27/2020 11:30:44 AM This report has been signed electronically.

## 2020-10-29 ENCOUNTER — Telehealth: Payer: Self-pay

## 2020-10-29 NOTE — Telephone Encounter (Signed)
  Follow up Call-  Call back number 10/27/2020  Post procedure Call Back phone  # [phone]  Permission to leave phone message Yes  Some recent data might be hidden     Patient questions:  Do you have a fever, pain , or abdominal swelling? No. Pain Score  0 *  Have you tolerated food without any problems? Yes.    Have you been able to return to your normal activities? Yes.    Do you have any questions about your discharge instructions: Diet   No. Medications  No. Follow up visit  No.  Do you have questions or concerns about your Care? No.  Actions: * If pain score is 4 or above: No action needed, pain <4.  1. Have you developed a fever since your procedure? no  2.   Have you had an respiratory symptoms (SOB or cough) since your procedure? no  3.   Have you tested positive for COVID 19 since your procedure no  4.   Have you had any family members/close contacts diagnosed with the COVID 19 since your procedure?  no   If yes to any of these questions please route to Joylene John, RN and Joella Prince, RN

## 2021-01-28 DIAGNOSIS — Z0000 Encounter for general adult medical examination without abnormal findings: Secondary | ICD-10-CM | POA: Diagnosis not present

## 2021-01-28 DIAGNOSIS — Z125 Encounter for screening for malignant neoplasm of prostate: Secondary | ICD-10-CM | POA: Diagnosis not present

## 2021-02-02 ENCOUNTER — Other Ambulatory Visit: Payer: Self-pay | Admitting: Internal Medicine

## 2021-02-02 DIAGNOSIS — L570 Actinic keratosis: Secondary | ICD-10-CM | POA: Diagnosis not present

## 2021-02-02 DIAGNOSIS — Z1331 Encounter for screening for depression: Secondary | ICD-10-CM | POA: Diagnosis not present

## 2021-02-02 DIAGNOSIS — R82998 Other abnormal findings in urine: Secondary | ICD-10-CM | POA: Diagnosis not present

## 2021-02-02 DIAGNOSIS — E785 Hyperlipidemia, unspecified: Secondary | ICD-10-CM | POA: Diagnosis not present

## 2021-02-02 DIAGNOSIS — Z1339 Encounter for screening examination for other mental health and behavioral disorders: Secondary | ICD-10-CM | POA: Diagnosis not present

## 2021-02-02 DIAGNOSIS — N529 Male erectile dysfunction, unspecified: Secondary | ICD-10-CM | POA: Diagnosis not present

## 2021-02-02 DIAGNOSIS — E663 Overweight: Secondary | ICD-10-CM | POA: Diagnosis not present

## 2021-02-02 DIAGNOSIS — Z0000 Encounter for general adult medical examination without abnormal findings: Secondary | ICD-10-CM | POA: Diagnosis not present

## 2021-02-02 DIAGNOSIS — K635 Polyp of colon: Secondary | ICD-10-CM | POA: Diagnosis not present

## 2021-02-02 DIAGNOSIS — D692 Other nonthrombocytopenic purpura: Secondary | ICD-10-CM | POA: Diagnosis not present

## 2021-03-04 ENCOUNTER — Ambulatory Visit
Admission: RE | Admit: 2021-03-04 | Discharge: 2021-03-04 | Disposition: A | Discharge status: Home / Self Care | Payer: No Typology Code available for payment source | Source: Ambulatory Visit | Attending: Internal Medicine | Admitting: Internal Medicine

## 2021-03-04 DIAGNOSIS — E785 Hyperlipidemia, unspecified: Secondary | ICD-10-CM

## 2021-03-24 ENCOUNTER — Other Ambulatory Visit: Payer: Self-pay | Admitting: Internal Medicine

## 2021-03-24 DIAGNOSIS — R911 Solitary pulmonary nodule: Secondary | ICD-10-CM

## 2021-06-16 ENCOUNTER — Other Ambulatory Visit: Payer: No Typology Code available for payment source

## 2021-06-16 ENCOUNTER — Ambulatory Visit
Admission: RE | Admit: 2021-06-16 | Discharge: 2021-06-16 | Disposition: A | Discharge status: Home / Self Care | Payer: PPO | Source: Ambulatory Visit | Attending: Internal Medicine | Admitting: Internal Medicine

## 2021-06-16 ENCOUNTER — Other Ambulatory Visit: Payer: Self-pay | Admitting: Internal Medicine

## 2021-06-16 DIAGNOSIS — R911 Solitary pulmonary nodule: Secondary | ICD-10-CM

## 2021-06-16 DIAGNOSIS — I700 Atherosclerosis of aorta: Secondary | ICD-10-CM | POA: Diagnosis not present

## 2021-06-16 DIAGNOSIS — R918 Other nonspecific abnormal finding of lung field: Secondary | ICD-10-CM | POA: Diagnosis not present

## 2021-09-14 DIAGNOSIS — Z961 Presence of intraocular lens: Secondary | ICD-10-CM | POA: Diagnosis not present

## 2021-09-14 DIAGNOSIS — H3552 Pigmentary retinal dystrophy: Secondary | ICD-10-CM | POA: Diagnosis not present

## 2022-02-03 DIAGNOSIS — R7989 Other specified abnormal findings of blood chemistry: Secondary | ICD-10-CM | POA: Diagnosis not present

## 2022-02-03 DIAGNOSIS — E785 Hyperlipidemia, unspecified: Secondary | ICD-10-CM | POA: Diagnosis not present

## 2022-02-10 DIAGNOSIS — E875 Hyperkalemia: Secondary | ICD-10-CM | POA: Diagnosis not present

## 2022-02-10 DIAGNOSIS — D692 Other nonthrombocytopenic purpura: Secondary | ICD-10-CM | POA: Diagnosis not present

## 2022-02-10 DIAGNOSIS — Z1339 Encounter for screening examination for other mental health and behavioral disorders: Secondary | ICD-10-CM | POA: Diagnosis not present

## 2022-02-10 DIAGNOSIS — Z0000 Encounter for general adult medical examination without abnormal findings: Secondary | ICD-10-CM | POA: Diagnosis not present

## 2022-02-10 DIAGNOSIS — Z125 Encounter for screening for malignant neoplasm of prostate: Secondary | ICD-10-CM | POA: Diagnosis not present

## 2022-02-10 DIAGNOSIS — I700 Atherosclerosis of aorta: Secondary | ICD-10-CM | POA: Diagnosis not present

## 2022-02-10 DIAGNOSIS — I491 Atrial premature depolarization: Secondary | ICD-10-CM | POA: Diagnosis not present

## 2022-02-10 DIAGNOSIS — E785 Hyperlipidemia, unspecified: Secondary | ICD-10-CM | POA: Diagnosis not present

## 2022-02-10 DIAGNOSIS — R911 Solitary pulmonary nodule: Secondary | ICD-10-CM | POA: Diagnosis not present

## 2022-02-10 DIAGNOSIS — Z1331 Encounter for screening for depression: Secondary | ICD-10-CM | POA: Diagnosis not present

## 2022-04-24 DIAGNOSIS — Z23 Encounter for immunization: Secondary | ICD-10-CM | POA: Diagnosis not present

## 2022-09-20 DIAGNOSIS — H3552 Pigmentary retinal dystrophy: Secondary | ICD-10-CM | POA: Diagnosis not present

## 2022-09-20 DIAGNOSIS — H43811 Vitreous degeneration, right eye: Secondary | ICD-10-CM | POA: Diagnosis not present

## 2022-09-20 DIAGNOSIS — Z961 Presence of intraocular lens: Secondary | ICD-10-CM | POA: Diagnosis not present

## 2023-02-01 DIAGNOSIS — L089 Local infection of the skin and subcutaneous tissue, unspecified: Secondary | ICD-10-CM | POA: Diagnosis not present

## 2023-02-01 DIAGNOSIS — L723 Sebaceous cyst: Secondary | ICD-10-CM | POA: Diagnosis not present

## 2023-02-03 DIAGNOSIS — L723 Sebaceous cyst: Secondary | ICD-10-CM | POA: Diagnosis not present

## 2023-02-03 DIAGNOSIS — L089 Local infection of the skin and subcutaneous tissue, unspecified: Secondary | ICD-10-CM | POA: Diagnosis not present

## 2023-02-26 IMAGING — CT CT CHEST W/O CM
2 of 5 series · 15 of 36 positions shown, 18 images · non-contrast
Comparison: 03/04/2021

CLINICAL DATA: Follow-up pulmonary nodules

EXAM:
CT CHEST WITHOUT CONTRAST
TECHNIQUE: Multidetector CT imaging of the chest was performed following the
standard protocol without IV contrast.

[Series 4: chest 2.00 br40 s3 · coronal · 0.66mm/px · 3 of 179 slices shown]
[im 36/179  lung]
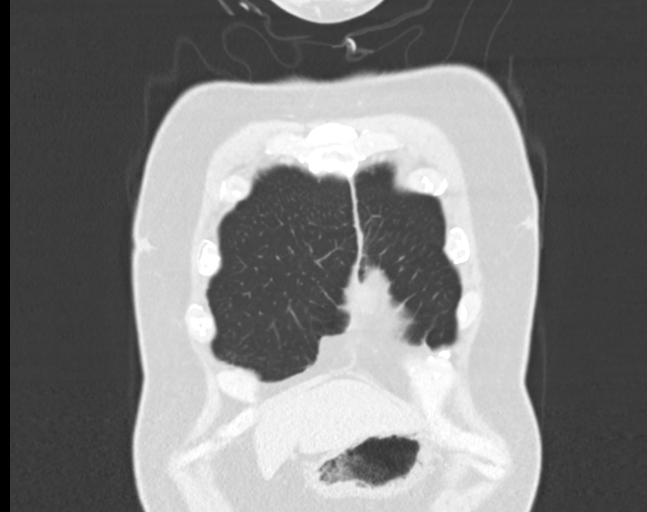
[im 72/179  lung]
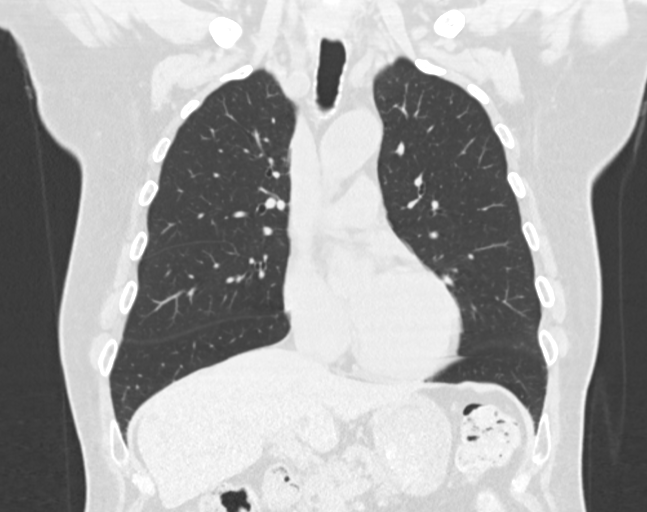
[im 107/179  lung]
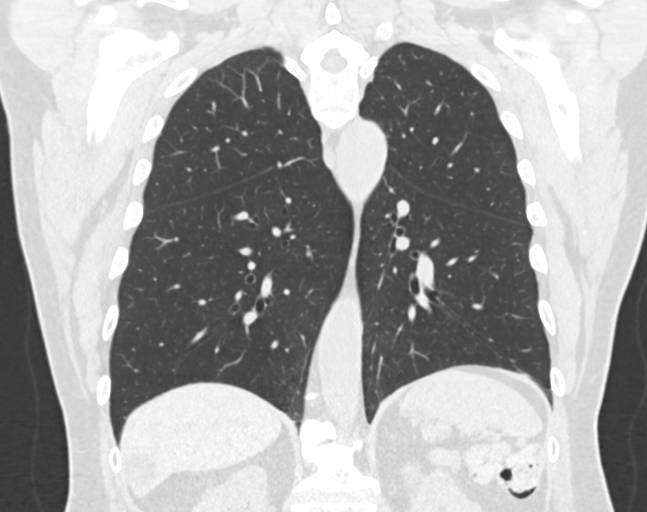

[Series 10: chest 1.00 br40 s3 super d · axial · 0.76mm/px · z∈[+1387,+1681]mm · 12 of 425 slices shown, 15 images]
[im 29/425  mediastinal]
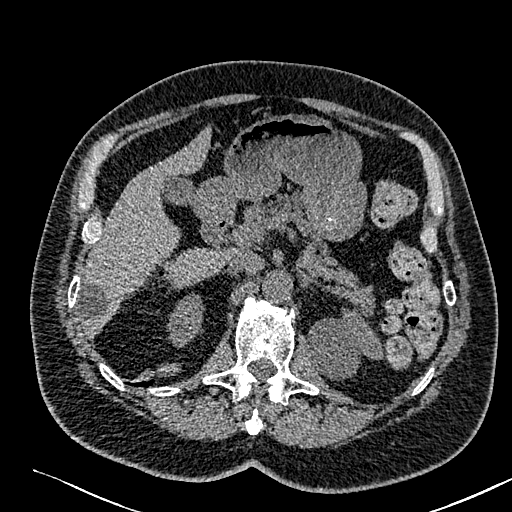
[im 29/425  lung]
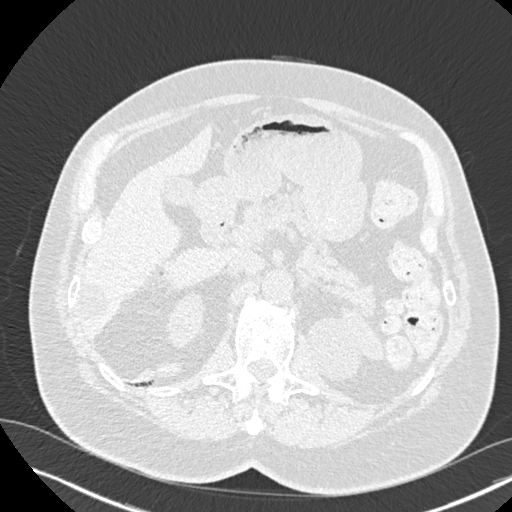
[im 57/425  lung]
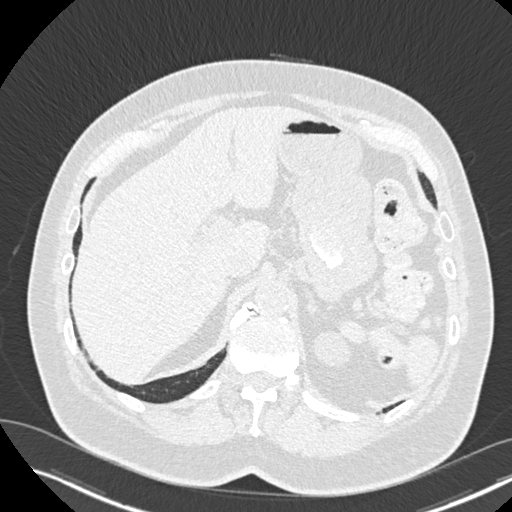
[im 85/425  lung]
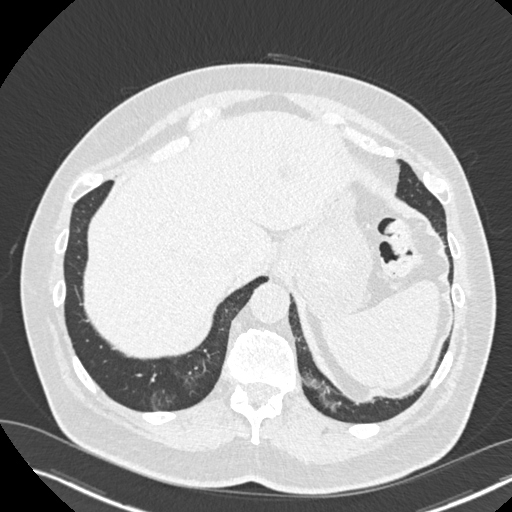
[im 142/425  lung]
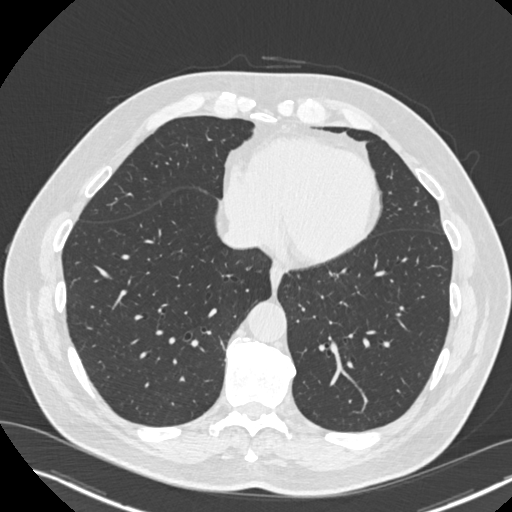
[im 170/425  mediastinal]
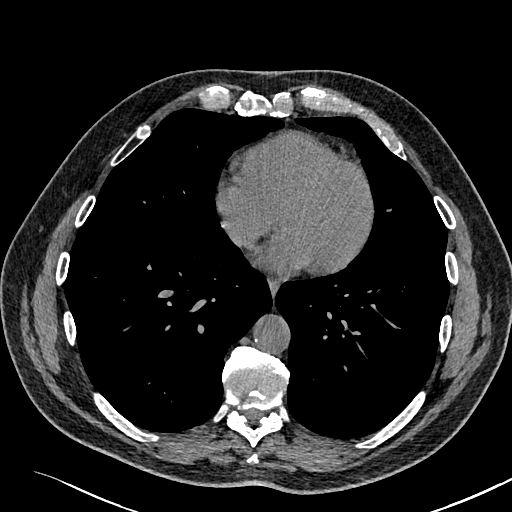
[im 170/425  lung]
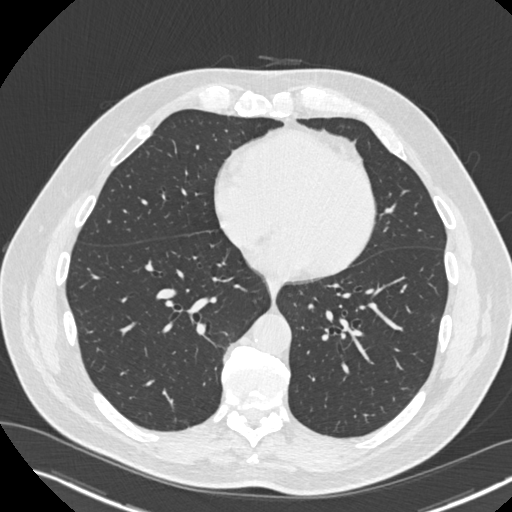
[im 198/425  lung]
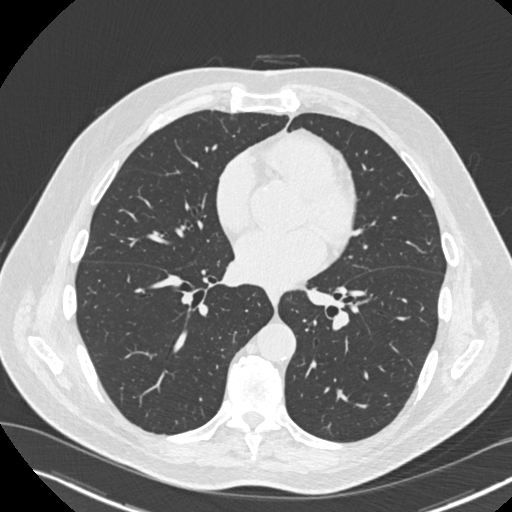
[im 227/425  lung]
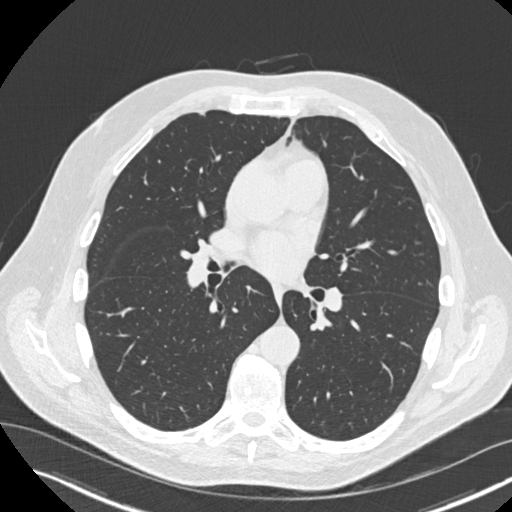
[im 255/425  lung]
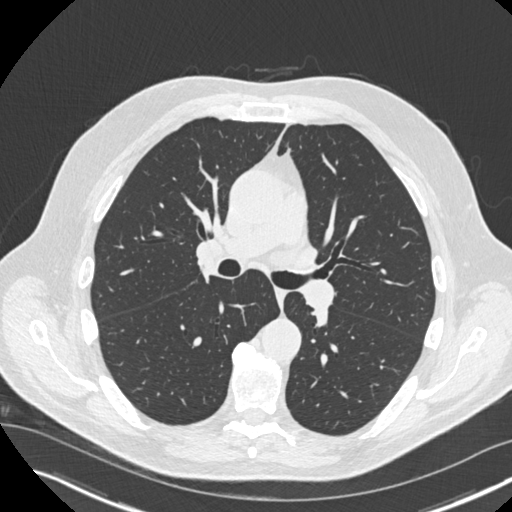
[im 283/425  mediastinal]
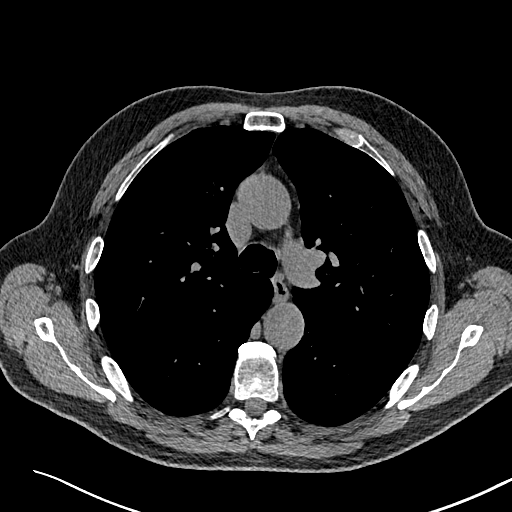
[im 283/425  lung]
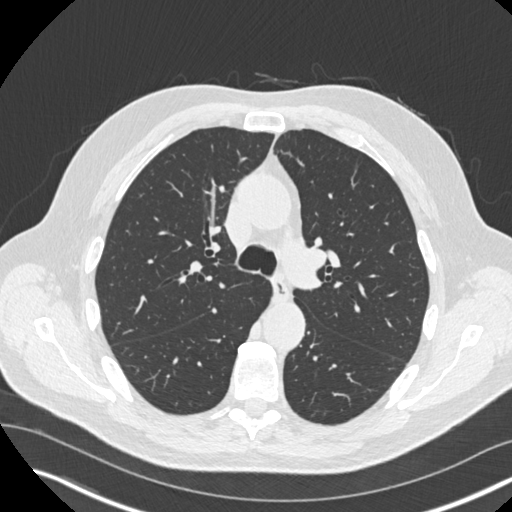
[im 340/425  lung]
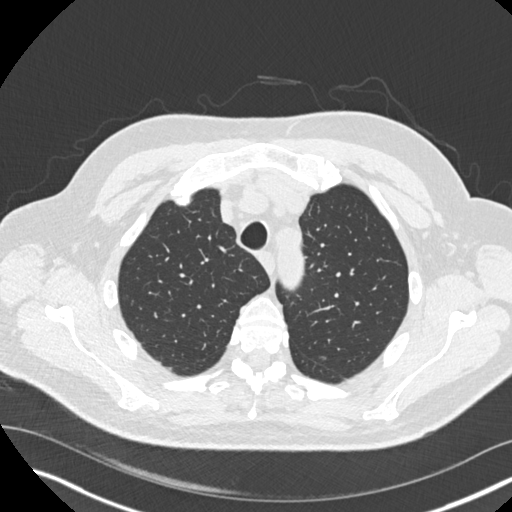
[im 368/425  lung]
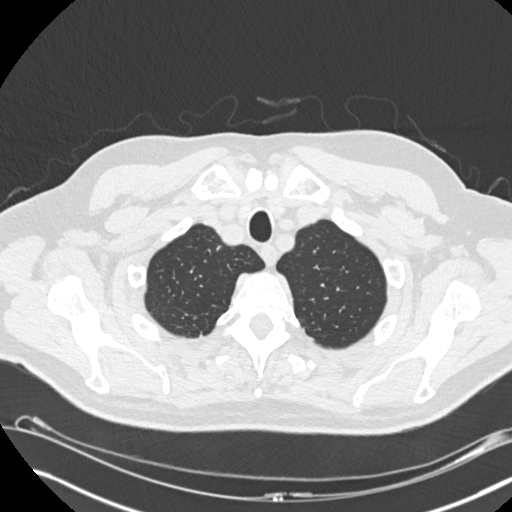
[im 396/425  lung]
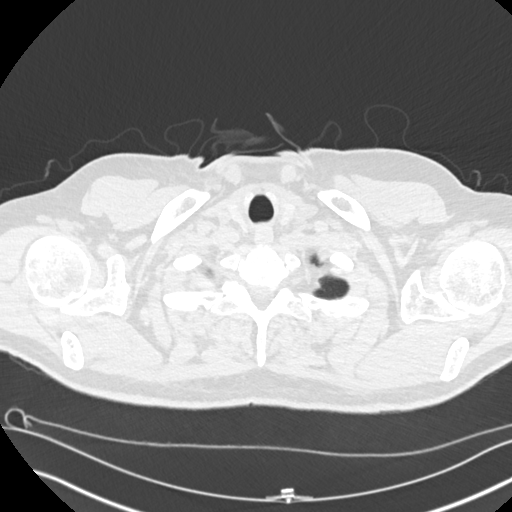

[15 of 36 positions shown; findings below may reference images not displayed]

FINDINGS: Cardiovascular: Somewhat limited due to lack of IV contrast.
Atherosclerotic calcifications are noted. No aneurysmal dilatation
is seen. Coronary calcifications are noted. No cardiac enlargement
is seen. Pulmonary artery as visualized is within normal limits.

Mediastinum/Nodes: Thoracic inlet is within normal limits. No
sizable hilar or mediastinal adenopathy is noted. The esophagus as
visualized is within normal limits.

Lungs/Pleura: Lungs are well aerated bilaterally. No focal
infiltrate or sizable effusion is seen. Small right lower lobe
pulmonary nodules are noted on image number 130 and 127 of series 8.
These measure less than 4 mm. Similar findings are noted on image
number 123 of series 8. Peri fissural lymph nodes are again noted
along the minor fissure. A nodular density in the right middle lobe
is noted anteriorly best seen on image number 125 of series 8. This
is stable from the prior exam. Tiny right upper lobe nodule is again
noted on image number 74 of series 8. No new focal nodules are seen.

Upper Abdomen: Visualized upper abdomen demonstrates hypodensities
within the liver and right kidney most consistent with cysts.

Musculoskeletal: Degenerative changes of the thoracic spine are
seen. No rib abnormality is noted.
IMPRESSION: Multiple nodules scattered throughout the right lung similar to that
seen on the prior CT examination. The largest of these again
measures 6 mm in the right middle lobe. Given their stability, a
follow-up noncontrast CT is optional for low risk patients but
recommended for high risk patients. This recommendation follows the
consensus
statement: Guidelines for Management of Incidental Pulmonary Nodules
Detected on CT Images: From the [HOSPITAL] 0417; Radiology
0417; [DATE].

Scattered renal and hepatic cysts.

No new focal abnormality is noted.

Aortic Atherosclerosis (CIHI7-MQ1.1).

## 2023-03-03 DIAGNOSIS — Z8262 Family history of osteoporosis: Secondary | ICD-10-CM | POA: Diagnosis not present

## 2023-03-03 DIAGNOSIS — Z125 Encounter for screening for malignant neoplasm of prostate: Secondary | ICD-10-CM | POA: Diagnosis not present

## 2023-03-03 DIAGNOSIS — E785 Hyperlipidemia, unspecified: Secondary | ICD-10-CM | POA: Diagnosis not present

## 2023-03-03 DIAGNOSIS — R7989 Other specified abnormal findings of blood chemistry: Secondary | ICD-10-CM | POA: Diagnosis not present

## 2023-03-10 DIAGNOSIS — Z0000 Encounter for general adult medical examination without abnormal findings: Secondary | ICD-10-CM | POA: Diagnosis not present

## 2023-03-10 DIAGNOSIS — D692 Other nonthrombocytopenic purpura: Secondary | ICD-10-CM | POA: Diagnosis not present

## 2023-03-10 DIAGNOSIS — Z1339 Encounter for screening examination for other mental health and behavioral disorders: Secondary | ICD-10-CM | POA: Diagnosis not present

## 2023-03-10 DIAGNOSIS — I700 Atherosclerosis of aorta: Secondary | ICD-10-CM | POA: Diagnosis not present

## 2023-03-10 DIAGNOSIS — Z1331 Encounter for screening for depression: Secondary | ICD-10-CM | POA: Diagnosis not present

## 2023-03-10 DIAGNOSIS — I491 Atrial premature depolarization: Secondary | ICD-10-CM | POA: Diagnosis not present

## 2023-03-10 DIAGNOSIS — N529 Male erectile dysfunction, unspecified: Secondary | ICD-10-CM | POA: Diagnosis not present

## 2023-03-10 DIAGNOSIS — E785 Hyperlipidemia, unspecified: Secondary | ICD-10-CM | POA: Diagnosis not present

## 2023-03-10 DIAGNOSIS — L570 Actinic keratosis: Secondary | ICD-10-CM | POA: Diagnosis not present

## 2023-03-10 DIAGNOSIS — E663 Overweight: Secondary | ICD-10-CM | POA: Diagnosis not present

## 2023-03-10 DIAGNOSIS — R911 Solitary pulmonary nodule: Secondary | ICD-10-CM | POA: Diagnosis not present

## 2023-04-05 DIAGNOSIS — D0462 Carcinoma in situ of skin of left upper limb, including shoulder: Secondary | ICD-10-CM | POA: Diagnosis not present

## 2023-04-05 DIAGNOSIS — D485 Neoplasm of uncertain behavior of skin: Secondary | ICD-10-CM | POA: Diagnosis not present

## 2023-04-05 DIAGNOSIS — D2371 Other benign neoplasm of skin of right lower limb, including hip: Secondary | ICD-10-CM | POA: Diagnosis not present

## 2023-04-05 DIAGNOSIS — L814 Other melanin hyperpigmentation: Secondary | ICD-10-CM | POA: Diagnosis not present

## 2023-04-05 DIAGNOSIS — L570 Actinic keratosis: Secondary | ICD-10-CM | POA: Diagnosis not present

## 2023-04-05 DIAGNOSIS — L821 Other seborrheic keratosis: Secondary | ICD-10-CM | POA: Diagnosis not present

## 2023-04-16 DIAGNOSIS — Z23 Encounter for immunization: Secondary | ICD-10-CM | POA: Diagnosis not present

## 2023-04-19 DIAGNOSIS — T07XXXA Unspecified multiple injuries, initial encounter: Secondary | ICD-10-CM | POA: Diagnosis not present

## 2023-04-19 DIAGNOSIS — S0993XA Unspecified injury of face, initial encounter: Secondary | ICD-10-CM | POA: Diagnosis not present

## 2023-04-19 DIAGNOSIS — W19XXXA Unspecified fall, initial encounter: Secondary | ICD-10-CM | POA: Diagnosis not present

## 2023-10-04 ENCOUNTER — Encounter: Payer: Self-pay | Admitting: Gastroenterology

## 2023-10-18 DIAGNOSIS — H33322 Round hole, left eye: Secondary | ICD-10-CM | POA: Diagnosis not present

## 2023-10-18 DIAGNOSIS — H43811 Vitreous degeneration, right eye: Secondary | ICD-10-CM | POA: Diagnosis not present

## 2023-10-18 DIAGNOSIS — H3552 Pigmentary retinal dystrophy: Secondary | ICD-10-CM | POA: Diagnosis not present

## 2023-10-21 DIAGNOSIS — H353131 Nonexudative age-related macular degeneration, bilateral, early dry stage: Secondary | ICD-10-CM | POA: Diagnosis not present

## 2023-10-21 DIAGNOSIS — H33322 Round hole, left eye: Secondary | ICD-10-CM | POA: Diagnosis not present

## 2023-10-21 DIAGNOSIS — H35363 Drusen (degenerative) of macula, bilateral: Secondary | ICD-10-CM | POA: Diagnosis not present

## 2023-10-21 DIAGNOSIS — H43811 Vitreous degeneration, right eye: Secondary | ICD-10-CM | POA: Diagnosis not present

## 2023-10-24 ENCOUNTER — Ambulatory Visit (AMBULATORY_SURGERY_CENTER)

## 2023-10-24 VITALS — Ht 74.0 in | Wt 206.0 lb

## 2023-10-24 DIAGNOSIS — Z860100 Personal history of colon polyps, unspecified: Secondary | ICD-10-CM

## 2023-10-24 MED ORDER — SUTAB 1479-225-188 MG PO TABS
12.0000 | ORAL_TABLET | ORAL | 0 refills | Status: DC
Start: 2023-10-24 — End: 2023-11-14

## 2023-10-24 NOTE — Progress Notes (Signed)
 No egg or soy allergy known to patient  No issues known to pt with past sedation with any surgeries or procedures Patient denies ever being told they had issues or difficulty with intubation  No FH of Malignant Hyperthermia Pt is not on diet pills Pt is not on  home 02  Pt is not on blood thinners  Pt denies issues with constipation  No A fib or A flutter. Hx SVT with ablation in 2017.  Have any cardiac testing pending--no LOA: independent  Prep: suprep   Patient's chart reviewed by Rogena Class CNRA prior to previsit and patient appropriate for the [facility].  Previsit completed and red dot placed by patient's name on their procedure day (on provider's schedule).     PV completed with patient. Prep instructions sent via mychart and home address.

## 2023-11-04 ENCOUNTER — Encounter: Payer: Self-pay | Admitting: Gastroenterology

## 2023-11-07 DIAGNOSIS — H33322 Round hole, left eye: Secondary | ICD-10-CM | POA: Diagnosis not present

## 2023-11-14 ENCOUNTER — Ambulatory Visit: Admitting: Gastroenterology

## 2023-11-14 ENCOUNTER — Encounter: Payer: Self-pay | Admitting: Gastroenterology

## 2023-11-14 VITALS — BP 148/80 | HR 55 | Temp 98.1°F | Resp 15 | Ht 74.0 in | Wt 206.0 lb

## 2023-11-14 DIAGNOSIS — K5520 Angiodysplasia of colon without hemorrhage: Secondary | ICD-10-CM

## 2023-11-14 DIAGNOSIS — D120 Benign neoplasm of cecum: Secondary | ICD-10-CM

## 2023-11-14 DIAGNOSIS — K6389 Other specified diseases of intestine: Secondary | ICD-10-CM

## 2023-11-14 DIAGNOSIS — K635 Polyp of colon: Secondary | ICD-10-CM | POA: Diagnosis not present

## 2023-11-14 DIAGNOSIS — D123 Benign neoplasm of transverse colon: Secondary | ICD-10-CM | POA: Diagnosis not present

## 2023-11-14 DIAGNOSIS — K648 Other hemorrhoids: Secondary | ICD-10-CM

## 2023-11-14 DIAGNOSIS — Z1211 Encounter for screening for malignant neoplasm of colon: Secondary | ICD-10-CM

## 2023-11-14 DIAGNOSIS — Z860100 Personal history of colon polyps, unspecified: Secondary | ICD-10-CM

## 2023-11-14 DIAGNOSIS — Z860101 Personal history of adenomatous and serrated colon polyps: Secondary | ICD-10-CM | POA: Diagnosis not present

## 2023-11-14 MED ORDER — SODIUM CHLORIDE 0.9 % IV SOLN: 500.0000 mL | Freq: Once | INTRAVENOUS | Status: DC

## 2023-11-14 NOTE — Progress Notes (Signed)
 Called to room to assist during endoscopic procedure.  Patient ID and intended procedure confirmed with present staff. Received instructions for my participation in the procedure from the performing physician.

## 2023-11-14 NOTE — Progress Notes (Signed)
 [facility] History and Physical   Primary Care Physician:  Windell Hasty, DO   Reason for Procedure:   History of colon polyps  Plan:    colonoscopy     HPI: Joshua Poole is a 73 y.o. male  here for colonoscopy surveillance - lat exam 10/2020 - multiple polyps removed, history of advanced sessile serrated polyps removed in the past.  Patient denies any bowel symptoms at this time. No family history of colon cancer known. Otherwise feels well without any cardiopulmonary symptoms.   I have discussed risks / benefits of anesthesia and endoscopic procedure with Joshua Poole and they wish to proceed with the exams as outlined today.    Past Medical History:  Diagnosis Date   Adenoma 2003   Allergy    Cataract    bilateral surgery on both   Colon polyps    CTS (carpal tunnel syndrome)    ED (erectile dysfunction)    Hydrocele, right    Palpitations    SAH (subarachnoid hemorrhage) (HCC) 01/2006   L THALAMIC, NO REOCCURRENCE  with fall   SVT (supraventricular tachycardia) (HCC)    had cardiac ablation  october 2017    Past Surgical History:  Procedure Laterality Date   ABLATION OF DYSRHYTHMIC FOCUS  04/05/2016   CATARACT EXTRACTION, BILATERAL Bilateral    COLONOSCOPY     CYST REMOVAL NECK Right 05/2014   sebaceous cyst    ELECTROPHYSIOLOGIC STUDY N/A 04/05/2016   Procedure: SVT Ablation;  Surgeon: Tammie Fall, MD;  Location: [facility] INVASIVE CV LAB;  Service: Cardiovascular;  Laterality: N/A;   POLYPECTOMY      Prior to Admission medications   Medication Sig Start Date End Date Taking? Authorizing Provider  atorvastatin (LIPITOR) 40 MG tablet Take 40 mg by mouth daily.   Yes [provider]  calcium carbonate (OSCAL) 1500 (600 Ca) MG TABS tablet Take 600 mg of elemental calcium by mouth daily with breakfast.   Yes [provider]  cetirizine (ZYRTEC) 10 MG tablet Take 10 mg by mouth daily.   Yes [provider]  Cholecalciferol  (VITAMIN D3 PO) Take by mouth.   Yes [provider]  COENZYME Q-10 PO Take 1 capsule by mouth daily.   Yes [provider]  GARLIC PO Take by mouth. Aged garlic extract 4 daily   Yes [provider]  glucosamine-chondroitin 500-400 MG tablet Take 1 tablet by mouth 3 (three) times daily.   Yes [provider]  Multiple Vitamin (MULTIVITAMIN) tablet Take 1 tablet by mouth daily.   Yes [provider]  Multiple Vitamins-Minerals (PRESERVISION AREDS 2+MULTI VIT) CAPS Take 1 tablet by mouth in the morning and at bedtime.   Yes [provider]  Omega-3 Fatty Acids (FISH OIL) 1000 MG CAPS Take 1 capsule by mouth daily.   Yes [provider]  TURMERIC PO Take 1,000 mg by mouth daily.   Yes [provider]  tadalafil (CIALIS) 10 MG tablet Take 10 mg by mouth daily as needed.    [provider]    Current Outpatient Medications  Medication Sig Dispense Refill   atorvastatin (LIPITOR) 40 MG tablet Take 40 mg by mouth daily.     calcium carbonate (OSCAL) 1500 (600 Ca) MG TABS tablet Take 600 mg of elemental calcium by mouth daily with breakfast.     cetirizine (ZYRTEC) 10 MG tablet Take 10 mg by mouth daily.     Cholecalciferol (VITAMIN D3 PO) Take by mouth.  COENZYME Q-10 PO Take 1 capsule by mouth daily.     GARLIC PO Take by mouth. Aged garlic extract 4 daily     glucosamine-chondroitin 500-400 MG tablet Take 1 tablet by mouth 3 (three) times daily.     Multiple Vitamin (MULTIVITAMIN) tablet Take 1 tablet by mouth daily.     Multiple Vitamins-Minerals (PRESERVISION AREDS 2+MULTI VIT) CAPS Take 1 tablet by mouth in the morning and at bedtime.     Omega-3 Fatty Acids (FISH OIL) 1000 MG CAPS Take 1 capsule by mouth daily.     TURMERIC PO Take 1,000 mg by mouth daily.     tadalafil (CIALIS) 10 MG tablet Take 10 mg by mouth daily as needed.     Current Facility-Administered Medications  Medication Dose Route Frequency  Provider Last Rate Last Admin   0.9 %  sodium chloride  infusion  500 mL Intravenous Once Aarna Mihalko, Lendon Queen, MD        Allergies as of 11/14/2023   (No Known Allergies)    Family History  Problem Relation Age of Onset   Hypertension Mother    Diabetes Father    Diabetes Brother    Colon cancer Paternal Aunt    Lung cancer Paternal Uncle    Colon cancer Paternal Aunt    Brain cancer Cousin    Lung cancer Cousin    Colon polyps Neg Hx    Esophageal cancer Neg Hx    Rectal cancer Neg Hx    Stomach cancer Neg Hx     Social History   Socioeconomic History   Marital status: Married    Spouse name: susan   Number of children: 2   Years of education: 12   Highest education level: Not on file  Occupational History   Occupation: retired  Tobacco Use   Smoking status: Never   Smokeless tobacco: Never  Vaping Use   Vaping status: Never Used  Substance and Sexual Activity   Alcohol  use: Yes    Comment: occasionally   Drug use: No   Sexual activity: Not on file  Other Topics Concern   Not on file  Social History Narrative   Not on file   Social Drivers of Health   Financial Resource Strain: Not on file  Food Insecurity: Not on file  Transportation Needs: Not on file  Physical Activity: Not on file  Stress: Not on file  Social Connections: Not on file  Intimate Partner Violence: Not on file    Review of Systems: All other review of systems negative except as mentioned in the HPI.  Physical Exam: Vital signs BP (!) 150/93 (BP Location: Right Arm, Patient Position: Sitting, Cuff Size: Normal)   Pulse 63   Temp 98.1 F (36.7 C) (Temporal)   Ht 6\' 2"  (1.88 m)   Wt 206 lb (93.4 kg)   SpO2 100%   BMI 26.45 kg/m   General:   Alert,  Well-developed, pleasant and cooperative in NAD Lungs:  Clear throughout to auscultation.   Heart:  Regular rate and rhythm Abdomen:  Soft, nontender and nondistended.   Neuro/Psych:  Alert and cooperative. Normal mood and  affect. A and O x 3  Christi Coward, MD [facility]

## 2023-11-14 NOTE — Progress Notes (Signed)
 Vitals-DT  Pt's states no medical or surgical changes since previsit or office visit.

## 2023-11-14 NOTE — Progress Notes (Signed)
 To pacu, VSS. Report to Rn.tb

## 2023-11-14 NOTE — Patient Instructions (Signed)

## 2023-11-14 NOTE — Op Note (Signed)
 [facility] Patient Name: Joshua Poole Procedure Date: 11/14/2023 2:28 PM MRN: [id number] Endoscopist: Landon Pinion P. General Kenner , MD, [id number] Age: 73 Referring MD:  Date of Birth: 04-May-1951 Gender: Male Account #: [id number] Procedure:                Colonoscopy Indications:              High risk colon cancer surveillance: Personal                            history of colonic polyps - numerous polyps removed                            in the last including advanced sessile serrated                            polyps. lat exam 10/2020 - 7 polyps removed Medicines:                Monitored Anesthesia Care Procedure:                Pre-Anesthesia Assessment:                           - Prior to the procedure, a History and Physical                            was performed, and patient medications and                            allergies were reviewed. The patient's tolerance of                            previous anesthesia was also reviewed. The risks                            and benefits of the procedure and the sedation                            options and risks were discussed with the patient.                            All questions were answered, and informed consent                            was obtained. Prior Anticoagulants: The patient has                            taken no anticoagulant or antiplatelet agents. ASA                            Grade Assessment: II - A patient with mild systemic                            disease. After reviewing the risks and benefits,  the patient was deemed in satisfactory condition to                            undergo the procedure.                           After obtaining informed consent, the colonoscope                            was passed under direct vision. Throughout the                            procedure, the patient's blood pressure, pulse, and                            oxygen saturations  were monitored continuously. The                            Olympus Scope SN: [id number] was introduced through                            the anus and advanced to the the cecum, identified                            by appendiceal orifice and ileocecal valve. The                            colonoscopy was performed without difficulty. The                            patient tolerated the procedure well. The quality                            of the bowel preparation was adequate. The                            ileocecal valve, appendiceal orifice, and rectum                            were photographed. Scope In: 2:35:15 PM Scope Out: 2:57:36 PM Scope Withdrawal Time: 0 hours 15 minutes 10 seconds  Total Procedure Duration: 0 hours 22 minutes 21 seconds  Findings:                 The perianal and digital rectal examinations were                            normal.                           A diminutive polyp was found in the cecum. The                            polyp was flat. The polyp was removed with a cold  snare. Resection and retrieval were complete.                           A single angiodysplastic lesion was found in the                            cecum.                           A tattoo was seen in the ascending colon.                           Two flat and sessile polyps were found in the                            transverse colon. The polyps were 3 to 4 mm in                            size. These polyps were removed with a cold snare.                            Resection and retrieval were complete.                           Internal hemorrhoids were found during retroflexion.                           The exam was otherwise without abnormality. Complications:            No immediate complications. Estimated blood loss:                            Minimal. Estimated Blood Loss:     Estimated blood loss was minimal. Impression:               - One  diminutive polyp in the cecum, removed with a                            cold snare. Resected and retrieved.                           - A single colonic angiodysplastic lesion.                           - A tattoo was seen in the ascending colon.                           - Two 3 to 4 mm polyps in the transverse colon,                            removed with a cold snare. Resected and retrieved.                           - Internal hemorrhoids.                           -  The examination was otherwise normal. Recommendation:           - Patient has a contact number available for                            emergencies. The signs and symptoms of potential                            delayed complications were discussed with the                            patient. Return to normal activities tomorrow.                            Written discharge instructions were provided to the                            patient.                           - Resume previous diet.                           - Continue present medications.                           - Await pathology results.                           - Consideration for another colonoscopy in 5 years                            given the burden of polyps the patient has had in                            the past (if he wishes to continue surveillance at                            that time, age 49), can see me in the office to                            discuss in 5 years Lendon Queen. Harmoni Lucus, MD 11/14/2023 3:03:29 PM This report has been signed electronically.

## 2023-11-15 ENCOUNTER — Telehealth: Payer: Self-pay

## 2023-11-15 NOTE — Telephone Encounter (Signed)
  Follow up Call-     11/14/2023    2:02 PM 11/14/2023    1:56 PM  Call back number  Post procedure Call Back phone  # [phone]   Permission to leave phone message  Yes     Patient questions:  Do you have a fever, pain , or abdominal swelling? No. Pain Score  0 *  Have you tolerated food without any problems? Yes.    Have you been able to return to your normal activities? Yes.    Do you have any questions about your discharge instructions: Diet   No. Medications  No. Follow up visit  No.  Do you have questions or concerns about your Care? No.  Actions: * If pain score is 4 or above: No action needed, pain <4.

## 2023-11-18 LAB — SURGICAL PATHOLOGY

## 2023-11-19 ENCOUNTER — Encounter: Payer: Self-pay | Admitting: Gastroenterology

## 2024-03-06 DIAGNOSIS — D696 Thrombocytopenia, unspecified: Secondary | ICD-10-CM | POA: Diagnosis not present

## 2024-03-06 DIAGNOSIS — Z0189 Encounter for other specified special examinations: Secondary | ICD-10-CM | POA: Diagnosis not present

## 2024-03-06 DIAGNOSIS — Z125 Encounter for screening for malignant neoplasm of prostate: Secondary | ICD-10-CM | POA: Diagnosis not present

## 2024-03-06 DIAGNOSIS — E785 Hyperlipidemia, unspecified: Secondary | ICD-10-CM | POA: Diagnosis not present

## 2024-03-13 DIAGNOSIS — R6882 Decreased libido: Secondary | ICD-10-CM | POA: Diagnosis not present

## 2024-03-13 DIAGNOSIS — L570 Actinic keratosis: Secondary | ICD-10-CM | POA: Diagnosis not present

## 2024-03-13 DIAGNOSIS — E663 Overweight: Secondary | ICD-10-CM | POA: Diagnosis not present

## 2024-03-13 DIAGNOSIS — N529 Male erectile dysfunction, unspecified: Secondary | ICD-10-CM | POA: Diagnosis not present

## 2024-03-13 DIAGNOSIS — Z8679 Personal history of other diseases of the circulatory system: Secondary | ICD-10-CM | POA: Diagnosis not present

## 2024-03-13 DIAGNOSIS — E785 Hyperlipidemia, unspecified: Secondary | ICD-10-CM | POA: Diagnosis not present

## 2024-03-13 DIAGNOSIS — Z1389 Encounter for screening for other disorder: Secondary | ICD-10-CM | POA: Diagnosis not present

## 2024-03-13 DIAGNOSIS — Z0000 Encounter for general adult medical examination without abnormal findings: Secondary | ICD-10-CM | POA: Diagnosis not present

## 2024-03-13 DIAGNOSIS — R61 Generalized hyperhidrosis: Secondary | ICD-10-CM | POA: Diagnosis not present

## 2024-03-13 DIAGNOSIS — Z1331 Encounter for screening for depression: Secondary | ICD-10-CM | POA: Diagnosis not present

## 2024-03-13 DIAGNOSIS — L989 Disorder of the skin and subcutaneous tissue, unspecified: Secondary | ICD-10-CM | POA: Diagnosis not present

## 2024-04-03 DIAGNOSIS — K4090 Unilateral inguinal hernia, without obstruction or gangrene, not specified as recurrent: Secondary | ICD-10-CM | POA: Diagnosis not present

## 2024-04-10 DIAGNOSIS — Z23 Encounter for immunization: Secondary | ICD-10-CM | POA: Diagnosis not present

## 2024-04-10 DIAGNOSIS — D692 Other nonthrombocytopenic purpura: Secondary | ICD-10-CM | POA: Diagnosis not present

## 2024-04-10 DIAGNOSIS — L814 Other melanin hyperpigmentation: Secondary | ICD-10-CM | POA: Diagnosis not present

## 2024-04-10 DIAGNOSIS — D1801 Hemangioma of skin and subcutaneous tissue: Secondary | ICD-10-CM | POA: Diagnosis not present

## 2024-04-10 DIAGNOSIS — Z85828 Personal history of other malignant neoplasm of skin: Secondary | ICD-10-CM | POA: Diagnosis not present

## 2024-04-10 DIAGNOSIS — L570 Actinic keratosis: Secondary | ICD-10-CM | POA: Diagnosis not present

## 2024-04-10 DIAGNOSIS — L821 Other seborrheic keratosis: Secondary | ICD-10-CM | POA: Diagnosis not present

## 2024-05-14 DIAGNOSIS — H59812 Chorioretinal scars after surgery for detachment, left eye: Secondary | ICD-10-CM | POA: Diagnosis not present

## 2024-05-14 DIAGNOSIS — H353131 Nonexudative age-related macular degeneration, bilateral, early dry stage: Secondary | ICD-10-CM | POA: Diagnosis not present

## 2024-05-14 DIAGNOSIS — H35363 Drusen (degenerative) of macula, bilateral: Secondary | ICD-10-CM | POA: Diagnosis not present

## 2024-07-23 ENCOUNTER — Telehealth (HOSPITAL_BASED_OUTPATIENT_CLINIC_OR_DEPARTMENT_OTHER): Payer: Self-pay

## 2024-07-23 ENCOUNTER — Ambulatory Visit: Payer: Self-pay | Admitting: Surgery

## 2024-07-23 NOTE — Telephone Encounter (Signed)
 Patient scheduled for pre-op clearance on 08/06/24 with Lum Louis, NP.

## 2024-07-23 NOTE — Telephone Encounter (Signed)
"  ° °  Pre-operative Risk Assessment    Patient Name: Joshua Poole  DOB: 1950-09-27 MRN: [id number]   Date of last office visit: 04/23/2016  Date of next office visit: none  Request for Surgical Clearance    Procedure:  Inguinal hernia surgery  Date of Surgery:  Clearance TBD                                 Surgeon:  Donnice Lima, MD Surgeon's Group or Practice Name:  [facility]  Phone number:  [phone] Fax number:  [phone] or [phone] - Burnard Crete, LPN   Type of Clearance Requested:   - Medical  - Pharmacy:  Hold Aspirin -needs instruction on when to hold (I do not see it on our current list, however he has not been seen by cardiology since 2017)   Type of Anesthesia:  General    Additional requests/questions:  None  Signed, Patrcia Iverson CROME   07/23/2024, 11:46 AM   "

## 2024-07-23 NOTE — H&P (Signed)
 "  Subjective   Chief Complaint: Inguinal Hernia     History of Present Illness: Joshua Poole is a 74 y.o. male who is seen today as an office consultation at the request of Dr. Valentin for evaluation of Inguinal Hernia .   This is a 74 year old male who presents with a 75-month history of an enlarging bulge in his left groin.  He remembers last summer when he was climbing out of his truck when he felt a pulling burning sensation in his left groin.  That sensation resolved but then he started noticing a bulge in his groin when he got out of the shower each day.  This has enlarged and is causing some discomfort.  He has no symptoms on the right.  He denies any GI obstructive symptoms.  Review of Systems: A complete review of systems was obtained from the patient.  I have reviewed this information and discussed as appropriate with the patient.  See HPI as well for other ROS.  Review of Systems  Constitutional: Negative.   HENT: Negative.    Eyes: Negative.   Respiratory: Negative.    Cardiovascular: Negative.   Gastrointestinal: Negative.   Genitourinary: Negative.   Musculoskeletal: Negative.   Skin: Negative.   Neurological: Negative.   Endo/Heme/Allergies: Negative.   Psychiatric/Behavioral: Negative.        Medical History: History reviewed. No pertinent past medical history.  Patient Active Problem List  Diagnosis   SVT (supraventricular tachycardia) ([facility])    Past Surgical History:  Procedure Laterality Date   CATARACT EXTRACTION       No Known Allergies  Current Outpatient Medications on File Prior to Visit  Medication Sig Dispense Refill   atorvastatin (LIPITOR) 40 MG tablet 1 tablet Orally Once a day for 90 days     cholecalciferol (VITAMIN D3) 1000 unit capsule Take by mouth     CIALIS 10 mg tablet take 1 tablet 1-2 hours prior to an encoutner daily Oral; Duration: 30 days     co-enzyme Q-10, ubiquinone, 30 mg capsule Take 1 capsule by mouth once daily      loratadine (CLARITIN) 10 mg tablet 1 tablet Orally Once a day     multivitamin tablet Take 1 tablet by mouth once daily     vit C,E-Zn-coppr-lutein-zeaxan (OCUVITE LUTEIN AND ZEAXANTHIN) 60 mg-13.5 mg- 15 mg-2 mg-6 mg Cap Take 1 tablet by mouth every 12 (twelve) hours     doxycycline (VIBRA-TABS) 100 MG tablet 1 tablet Oral twice daily for 10 days (Patient not taking: Reported on 07/23/2024)     No current facility-administered medications on file prior to visit.    Family History  Problem Relation Age of Onset   High blood pressure (Hypertension) Mother    Diabetes Father    Obesity Brother    Diabetes Brother      Social History   Tobacco Use  Smoking Status Never  Smokeless Tobacco Never     Social History   Socioeconomic History   Marital status: Married  Tobacco Use   Smoking status: Never   Smokeless tobacco: Never  Substance and Sexual Activity   Alcohol  use: Never   Drug use: Never   Social Drivers of Health   Housing Stability: Unknown (07/23/2024)   Housing Stability Vital Sign    Homeless in the Last Year: No    Objective:    Vitals:   07/23/24 0958  PainSc: 0-No pain    There is no height or weight on file  to calculate BMI.  Physical Exam   Constitutional:  WDWN in NAD, conversant, no obvious deformities; lying in bed comfortably Eyes:  Pupils equal, round; sclera anicteric; moist conjunctiva; no lid lag HENT:  Oral mucosa moist; good dentition  Neck:  No masses palpated, trachea midline; no thyromegaly Lungs:  CTA bilaterally; normal respiratory effort CV:  Regular rate and rhythm; no murmurs; extremities well-perfused with no edema Abd:  +bowel sounds, soft, non-tender, no palpable organomegaly; no palpable hernias GU: Bilateral descended testes, no testicular masses, visible palpable left inguinal hernia that is reducible when the patient is standing.  No sign of right inguinal hernia with Valsalva. Musc: Normal gait; no apparent clubbing or  cyanosis in extremities Lymphatic:  No palpable cervical or axillary lymphadenopathy Skin:  Warm, dry; no sign of jaundice Psychiatric - alert and oriented x 4; calm mood and affect    Assessment and Plan:  Diagnoses and all orders for this visit:  Left inguinal hernia    Recommend left inguinal hernia repair with mesh.The surgical procedure has been discussed with the patient.  Potential risks, benefits, alternative treatments, and expected outcomes have been explained.  All of the patient's questions at this time have been answered.  The likelihood of reaching the patient's treatment goal is good.  The patient understands the proposed surgical procedure and wishes to proceed.  DONNICE DEWAYNE LIMA, MD  07/23/2024 11:19 AM   "

## 2024-07-23 NOTE — Telephone Encounter (Signed)
" ° °  Name: Joshua Poole  DOB: February 11, 1951  MRN: [id number]  Primary Cardiologist: None  Chart reviewed as part of pre-operative protocol coverage. Because of Lessie A Seith's past medical history and time since last visit, he will require a follow-up in-office visit in order to better assess preoperative cardiovascular risk.  Pre-op covering staff: - Please schedule appointment and call patient to inform them. If patient already had an upcoming appointment within acceptable timeframe, please add pre-op clearance to the appointment notes so provider is aware. - Please contact requesting surgeon's office via preferred method (i.e, phone, fax) to inform them of need for appointment prior to surgery.  Saddie GORMAN Cleaves, NP  07/23/2024, 11:50 AM   "

## 2024-08-06 ENCOUNTER — Ambulatory Visit: Admitting: Emergency Medicine

## 2024-08-13 ENCOUNTER — Encounter: Payer: Self-pay | Admitting: *Deleted

## 2024-08-14 ENCOUNTER — Ambulatory Visit: Admitting: Emergency Medicine

## 2024-08-14 ENCOUNTER — Encounter: Payer: Self-pay | Admitting: Emergency Medicine

## 2024-08-14 VITALS — BP 138/82 | HR 63 | Ht 74.0 in | Wt 215.0 lb

## 2024-08-14 DIAGNOSIS — I471 Supraventricular tachycardia, unspecified: Secondary | ICD-10-CM

## 2024-08-14 DIAGNOSIS — Z01810 Encounter for preprocedural cardiovascular examination: Secondary | ICD-10-CM | POA: Diagnosis not present

## 2024-08-14 DIAGNOSIS — I2510 Atherosclerotic heart disease of native coronary artery without angina pectoris: Secondary | ICD-10-CM

## 2024-08-14 NOTE — Progress Notes (Addendum)
 " Cardiology Office Note:    Date:  08/14/2024  ID:  Joshua Poole, DOB 20-Jun-1951, MRN [id number] PCP: Valentin Skates, DO  [facility] Providers Cardiologist:  None       Patient Profile:       Chief Complaint: Preoperative cardiovascular clearance History of Present Illness:  Joshua Poole is a 74 y.o. male with visit-pertinent history of SVT s/p ablation  Patient established with cardiology service on 02/2016 for evaluation of SVT.  He was noted to have SVT his entire adult life.  He ultimately underwent SVT ablation in 2017.  Was then last seen in clinic by Dr. Waddell on 04/2016 and had no recurrent sustained SVT episodes since his ablation.  His low-dose beta-blocker was discontinued.  He was to follow-up as needed.  Patient is now pending inguinal hernia surgery on date TBD with [facility] and requires cardiac clearance.   Discussed the use of AI scribe software for clinical note transcription with the patient, who gave verbal consent to proceed.  History of Present Illness Joshua Poole is a 74 year old male who presents for pre-surgical cardiac clearance.   He has SVT treated with ablation in September 2017 with no recurrent tachycardia and only occasional PACs. He is not on beta-blockers or calcium channel blockers. He has coronary artery plaque on CT cardiac scoring in 2022 and takes atorvastatin with LDL reduced to the 40s. He denies chest pain or shortness of breath and remains active, walking about 100 miles a month and doing yard work. He has hyperlipidemia now well controlled on medication and lifestyle changes.  He denies syncope, presyncope, lightheadedness, dizziness, orthopnea, PND, LEE.   Review of systems:  Please see the history of present illness. All other systems are reviewed and otherwise negative.      Studies Reviewed:    EKG Interpretation Date/Time:  Tuesday August 14 2024 13:45:43 EST Ventricular Rate:  63 PR  Interval:  166 QRS Duration:  110 QT Interval:  408 QTC Calculation: 417 R Axis:   -38  Text Interpretation: Normal sinus rhythm Left axis deviation When compared with ECG of 18-Nov-2014 14:35, PREVIOUS ECG IS PRESENT Confirmed by Rana Dixon [phone]) on 08/14/2024 1:49:39 PM    CT cardiac scoring 03/04/2021 IMPRESSION: 1. Coronary calcium score is 240 and this is at percentile 57 for patients same age, gender and ethnicity.  Echocardiogram 02/11/2016 Study Conclusions   - Left ventricle: The cavity size was normal. There was mild    concentric hypertrophy. Systolic function was normal. The    estimated ejection fraction was in the range of 60% to 65%. Wall    motion was normal; there were no regional wall motion    abnormalities. The study is not technically sufficient to allow    evaluation of LV diastolic function.  - Aortic valve: Transvalvular velocity was within the normal range.    There was no stenosis.  - Mitral valve: Transvalvular velocity was within the normal range.    There was no evidence for stenosis. There was no regurgitation.  - Right ventricle: The cavity size was mildly dilated. Wall    thickness was normal. Systolic function was normal.  - Right atrium: The atrium was mildly dilated.  - Tricuspid valve: There was mild regurgitation.  - Pulmonary arteries: PA peak pressure: 33 mm Hg (S).  Risk Assessment/Calculations:              Physical Exam:   VS:  BP 138/82  Pulse 63   Ht 6' 2 (1.88 m)   Wt 215 lb (97.5 kg)   SpO2 98%   BMI 27.60 kg/m    Wt Readings from Last 3 Encounters:  08/14/24 215 lb (97.5 kg)  11/14/23 206 lb (93.4 kg)  10/24/23 206 lb (93.4 kg)    GEN: Well nourished, well developed in no acute distress NECK: No JVD; No carotid bruits CARDIAC: RRR, no murmurs, rubs, gallops RESPIRATORY:  Clear to auscultation without rales, wheezing or rhonchi  ABDOMEN: Soft, non-tender, non-distended EXTREMITIES:  No edema; No acute deformity       Assessment and Plan:  Supraventricular tachycardia S/p SVT ablation on 03/2016 - He has had no recurrent sustained SVT since his ablation and denies any episodes of tachycardia or palpitations.  Not currently managed on beta-blockers or calcium channel blockers  Coronary artery calcification CT cardiac scoring 02/2021 showed CAC of 240 (57th percentile) - EKG today without acute ischemic features - He is stable without chest pains.  Remains quite active without any exertional symptoms.  No indication for further ischemic evaluation at this time - Managed by PCP on atorvastatin 40 mg daily  Preoperative cardiovascular clearance According to the Revised Cardiac Risk Index (RCRI), his Perioperative Risk of Major Cardiac Event is (%): 0.4. His Functional Capacity in METs is: 6.61 according to the [employer] Activity Status Index (DASI). Therefore, based on ACC/AHA guidelines, patient would be at acceptable risk for the planned procedure without further cardiovascular testing. I will route this recommendation to the requesting party via Epic fax function.      Dispo: He prefers to continue following routinely with his PCP.  He can follow-up with cardiology as needed.  Signed, Lum LITTIE Louis, NP  "

## 2024-08-14 NOTE — Patient Instructions (Addendum)
 Medication Instructions:  NO CHANGES *If you need a refill on your cardiac medications before your next appointment, please call your pharmacy*  Lab Work: NO LABS If you have labs (blood work) drawn today and your tests are completely normal, you will receive your results only by: MyChart Message (if you have MyChart) OR A paper copy in the mail If you have any lab test that is abnormal or we need to change your treatment, we will call you to review the results.  Testing/Procedures: NO TESTING  Follow-Up: At [facility], you and your health needs are our priority.  As part of our continuing mission to provide you with exceptional heart care, our providers are all part of one team.  This team includes your primary Cardiologist (physician) and Advanced Practice Providers or APPs (Physician Assistants and Nurse Practitioners) who all work together to provide you with the care you need, when you need it.  Your next appointment:   FOLLOW UP AS NEEDED

## 2024-09-06 ENCOUNTER — Encounter (HOSPITAL_BASED_OUTPATIENT_CLINIC_OR_DEPARTMENT_OTHER): Payer: Self-pay

## 2024-09-06 ENCOUNTER — Ambulatory Visit (HOSPITAL_BASED_OUTPATIENT_CLINIC_OR_DEPARTMENT_OTHER): Admit: 2024-09-06 | Admitting: Surgery

## 2024-09-06 SURGERY — REPAIR, HERNIA, INGUINAL, ADULT
Anesthesia: General | Laterality: Left
